# Patient Record
Sex: Female | Born: 1965 | Race: White | Hispanic: No | Marital: Married | Smoking: Current every day smoker
Health system: Southern US, Community
[De-identification: ages and names within clinical notes are randomized; demographics above are authoritative.]

## PROBLEM LIST (undated history)

## (undated) DIAGNOSIS — J45909 Unspecified asthma, uncomplicated: Secondary | ICD-10-CM

## (undated) HISTORY — PX: APPENDECTOMY: SHX54

## (undated) HISTORY — DX: Unspecified asthma, uncomplicated: J45.909

## (undated) HISTORY — PX: TONSILECTOMY/ADENOIDECTOMY WITH MYRINGOTOMY: SHX6125

---

## 1998-05-05 ENCOUNTER — Emergency Department (HOSPITAL_COMMUNITY): Admission: EM | Admit: 1998-05-05 | Discharge: 1998-05-06 | Payer: Self-pay | Admitting: Emergency Medicine

## 1999-03-26 ENCOUNTER — Emergency Department (HOSPITAL_COMMUNITY): Admission: EM | Admit: 1999-03-26 | Discharge: 1999-03-26 | Payer: Self-pay | Admitting: Emergency Medicine

## 1999-03-27 ENCOUNTER — Encounter: Payer: Self-pay | Admitting: Emergency Medicine

## 2021-08-23 ENCOUNTER — Emergency Department (HOSPITAL_COMMUNITY): Payer: Self-pay

## 2021-08-23 ENCOUNTER — Emergency Department (HOSPITAL_COMMUNITY)
Admission: EM | Admit: 2021-08-23 | Discharge: 2021-08-24 | Disposition: A | Discharge status: Home / Self Care | Payer: Self-pay | Attending: Emergency Medicine | Admitting: Emergency Medicine

## 2021-08-23 ENCOUNTER — Other Ambulatory Visit: Payer: Self-pay

## 2021-08-23 DIAGNOSIS — F17210 Nicotine dependence, cigarettes, uncomplicated: Secondary | ICD-10-CM | POA: Insufficient documentation

## 2021-08-23 DIAGNOSIS — J45909 Unspecified asthma, uncomplicated: Secondary | ICD-10-CM | POA: Insufficient documentation

## 2021-08-23 DIAGNOSIS — R102 Pelvic and perineal pain: Secondary | ICD-10-CM

## 2021-08-23 DIAGNOSIS — R112 Nausea with vomiting, unspecified: Secondary | ICD-10-CM | POA: Insufficient documentation

## 2021-08-23 DIAGNOSIS — D4959 Neoplasm of unspecified behavior of other genitourinary organ: Secondary | ICD-10-CM | POA: Insufficient documentation

## 2021-08-23 LAB — URINALYSIS, ROUTINE W REFLEX MICROSCOPIC
Bilirubin Urine: NEGATIVE
Glucose, UA: NEGATIVE mg/dL
Ketones, ur: 20 mg/dL — AB
Nitrite: NEGATIVE
Protein, ur: 100 mg/dL — AB
Specific Gravity, Urine: 1.021 (ref 1.005–1.030)
pH: 5 (ref 5.0–8.0)

## 2021-08-23 LAB — COMPREHENSIVE METABOLIC PANEL
ALT: 42 U/L (ref 0–44)
AST: 33 U/L (ref 15–41)
Albumin: 4.4 g/dL (ref 3.5–5.0)
Alkaline Phosphatase: 85 U/L (ref 38–126)
Anion gap: 16 — ABNORMAL HIGH (ref 5–15)
BUN: 12 mg/dL (ref 6–20)
CO2: 17 mmol/L — ABNORMAL LOW (ref 22–32)
Calcium: 9.8 mg/dL (ref 8.9–10.3)
Chloride: 101 mmol/L (ref 98–111)
Creatinine, Ser: 1.05 mg/dL — ABNORMAL HIGH (ref 0.44–1.00)
GFR, Estimated: >60 mL/min (ref >60)
Glucose, Bld: 149 mg/dL — ABNORMAL HIGH (ref 70–99)
Potassium: 3.6 mmol/L (ref 3.5–5.1)
Sodium: 134 mmol/L — ABNORMAL LOW (ref 135–145)
Total Bilirubin: 0.8 mg/dL (ref 0.3–1.2)
Total Protein: 7.6 g/dL (ref 6.5–8.1)

## 2021-08-23 LAB — CBC
HCT: 47.4 % — ABNORMAL HIGH (ref 36.0–46.0)
Hemoglobin: 15.9 g/dL — ABNORMAL HIGH (ref 12.0–15.0)
MCH: 28.2 pg (ref 26.0–34.0)
MCHC: 33.5 g/dL (ref 30.0–36.0)
MCV: 84 fL (ref 80.0–100.0)
Platelets: 403 10*3/uL — ABNORMAL HIGH (ref 150–400)
RBC: 5.64 MIL/uL — ABNORMAL HIGH (ref 3.87–5.11)
RDW: 12.7 % (ref 11.5–15.5)
WBC: 18 10*3/uL — ABNORMAL HIGH (ref 4.0–10.5)
nRBC: 0 % (ref 0.0–0.2)

## 2021-08-23 LAB — LIPASE, BLOOD: Lipase: 28 U/L (ref 11–51)

## 2021-08-23 MED ORDER — ONDANSETRON HCL 4 MG/2ML IJ SOLN
4.0000 mg | Freq: Once | INTRAMUSCULAR | Status: AC | PRN
  Administered 2021-08-23: 21:00:00 4 mg via INTRAVENOUS

## 2021-08-23 MED ORDER — MORPHINE SULFATE (PF) 2 MG/ML IV SOLN
2.0000 mg | Freq: Once | INTRAVENOUS | Status: AC
  Administered 2021-08-23: 22:00:00 2 mg via INTRAVENOUS
  Filled 2021-08-23: qty 1

## 2021-08-23 NOTE — ED Provider Notes (Signed)
Emergency Medicine Provider Triage Evaluation Note  Sandra Cordova , a 55 y.o. female  was evaluated in triage.  Pt complains of left flank pain that radiates into the left lower quadrant abdomen.  Ports associated nausea and vomiting.  Zofran did not help.  No history of kidney stones.  Also complains of dysuria and urgency.  No fever or chills.  Review of Systems  Positive:  Negative: See above   Physical Exam  BP (!) 150/97    Pulse 89    Temp 99.4 F (37.4 C) (Oral)    Resp 20    SpO2 98%  Gen:   Awake, no distress   Resp:  Normal effort  MSK:   Moves extremities without difficulty Other:  Left CVA tenderness left lower quadrant abdominal tenderness  Medical Decision Making  Medically screening exam initiated at 9:15 PM.  Appropriate orders placed.  Sandra Cordova was informed that the remainder of the evaluation will be completed by another provider, this initial triage assessment does not replace that evaluation, and the importance of remaining in the ED until their evaluation is complete.  Diaphoretic in triage.  In need of emergent care.   Hendricks Limes, PA-C 08/23/21 2116    Lorelle Gibbs, DO 08/24/21 [phone]

## 2021-08-23 NOTE — ED Triage Notes (Signed)
Pt c/o acute onset of LLQ abdominal pain radiating to back and groin area associated with NV, chills. Pt diaphoretic in triage.

## 2021-08-24 ENCOUNTER — Emergency Department (HOSPITAL_COMMUNITY): Payer: Self-pay

## 2021-08-24 MED ORDER — MORPHINE SULFATE (PF) 4 MG/ML IV SOLN
4.0000 mg | Freq: Once | INTRAVENOUS | Status: AC
  Administered 2021-08-24: 4 mg via INTRAVENOUS
  Filled 2021-08-24: qty 1

## 2021-08-24 MED ORDER — SODIUM CHLORIDE 0.9 % IV BOLUS
1000.0000 mL | Freq: Once | INTRAVENOUS | Status: AC
  Administered 2021-08-24: 1000 mL via INTRAVENOUS

## 2021-08-24 MED ORDER — HYDROMORPHONE HCL 1 MG/ML IJ SOLN
1.0000 mg | Freq: Once | INTRAMUSCULAR | Status: AC
  Administered 2021-08-24: 1 mg via INTRAVENOUS
  Filled 2021-08-24: qty 1

## 2021-08-24 MED ORDER — HYDROCODONE-ACETAMINOPHEN 5-325 MG PO TABS: 1.0000 | ORAL_TABLET | Freq: Four times a day (QID) | ORAL | 0 refills | Status: DC | PRN

## 2021-08-24 MED ORDER — ONDANSETRON HCL 4 MG PO TABS: 4.0000 mg | ORAL_TABLET | Freq: Four times a day (QID) | ORAL | 0 refills | Status: DC

## 2021-08-24 NOTE — ED Notes (Signed)
Pt teaching provided on medications that may cause drowsiness. Pt instructed not to drive or operate heavy machinery while taking the prescribed medication. Pt verbalized understanding.  ? ?Pt provided discharge instructions and prescription information. Pt was given the opportunity to ask questions and questions were answered. Discharge signature not obtained in the setting of the COVID-19 pandemic in order to reduce high touch surfaces.  ? ?

## 2021-08-24 NOTE — Discharge Instructions (Addendum)
1.  Ovarian abnormality has been identified in your ultrasound.  It is very important that you follow-up with a gynecologist as soon as possible.  You may choose someone with your family physician, use the referral number in your discharge instructions or set an appointment with [facility].  Do this is soon as possible. 2.  You have been given Vicodin to take for pain.  Take 1 to 2 tablets every 6 hours as needed.  May also take ibuprofen per package instructions every 6-8 hours for baseline pain control.  If you tend to get constipated, take an over-the-counter stool softener such as Colace.  Vicodin can make you constipated. 3.  Return to the emergency department for new worsening or concerning symptoms.

## 2021-08-24 NOTE — ED Provider Notes (Signed)
Patient is having intractable pain with findings of ovarian cystic mass.  No torsion identified by ultrasound.  Continue with pain control, possible GYN admit if pain remains intractable. Physical Exam  BP (!) 167/84    Pulse 83    Temp 97.9 F (36.6 C)    Resp 20    SpO2 94%   Physical Exam  ED Course/Procedures     Procedures  MDM    Patient is now pain-free.  We reviewed a follow-up plan with GYN.  We reviewed pain control with as needed Vicodin.  Patient feels comfortable going home at this time     Charlesetta Shanks, MD 08/24/21 1023

## 2021-08-24 NOTE — ED Notes (Signed)
Pt c/o lt flank pain for over one week  she has had blood in her urine and in her stool   med given at triage has helped

## 2021-09-04 ENCOUNTER — Encounter: Payer: Self-pay | Admitting: Obstetrics & Gynecology

## 2021-09-04 ENCOUNTER — Other Ambulatory Visit: Payer: Self-pay

## 2021-09-04 ENCOUNTER — Ambulatory Visit (INDEPENDENT_AMBULATORY_CARE_PROVIDER_SITE_OTHER): Payer: Self-pay | Admitting: Obstetrics & Gynecology

## 2021-09-04 VITALS — BP 147/96 | HR 73 | Ht 62.0 in | Wt 190.0 lb

## 2021-09-04 DIAGNOSIS — N83201 Unspecified ovarian cyst, right side: Secondary | ICD-10-CM

## 2021-09-04 NOTE — Progress Notes (Signed)
GYNECOLOGY OFFICE VISIT NOTE  History:   Sandra Cordova is a 56 y.o. PMP [id number] here today for discussion about management of incidentally found right ovarian cyst. Reported to the ER on 08/23/21 with left flank pain, CT Renal Stone Study incidentally showed the cyst.  Follow up ultrasound on 08/24/2021 showed minimally complex 7 cm right ovarian cyst.  She was referred to [place] for further management. She is accompanied by her husband.  Denies any past or present lower abdominal pain, just the upper left flank pain that sometimes feels like a sharp twinge. No nausea or vomiting. She denies any abnormal vaginal discharge, bleeding, pelvic pain or other gynecologic concerns.    Past Medical History:  Diagnosis Date   Asthma     Past Surgical History:  Procedure Laterality Date   APPENDECTOMY     TONSILECTOMY/ADENOIDECTOMY WITH MYRINGOTOMY      The following portions of the patient's history were reviewed and updated as appropriate: allergies, current medications, past family history, past medical history, past social history, past surgical history and problem list.   Health Maintenance:  Normal pap and mammogram over 15 years ago.  Benign colonoscopy on 04/07/2017.  Review of Systems:  Pertinent items noted in HPI and remainder of comprehensive ROS otherwise negative.  Physical Exam:  BP (!) 147/96    Pulse 73    Ht 5\' 2"  (1.575 m)    Wt 190 lb (86.2 kg)    BMI 34.75 kg/m  CONSTITUTIONAL: Well-developed, well-nourished female in no acute distress.  HEENT:  Normocephalic, atraumatic. External right and left ear normal. No scleral icterus.  NECK: Normal range of motion, supple, no masses noted on observation SKIN: No rash noted. Not diaphoretic. No erythema. No pallor. MUSCULOSKELETAL: Normal range of motion. No edema noted. NEUROLOGIC: Alert and oriented to person, place, and time. Normal muscle tone coordination. No cranial nerve deficit noted. PSYCHIATRIC: Normal mood and affect.  Normal behavior. Normal judgment and thought content. CARDIOVASCULAR: Normal heart rate noted RESPIRATORY: Effort and breath sounds normal, no problems with respiration noted ABDOMEN: No abdominal tenderness on exam, but upper left flank pain under ribs on palpation. No masses noted. No other overt distention noted.   PELVIC: Deferred  Labs and Imaging No results found for this or any previous visit (from the past 168 hour(s)). CT Renal Stone Study  Result Date: 08/23/2021 CLINICAL DATA:  Left flank pain with kidney stone suspected. EXAM: CT ABDOMEN AND PELVIS WITHOUT CONTRAST TECHNIQUE: Multidetector CT imaging of the abdomen and pelvis was performed following the standard protocol without IV contrast. COMPARISON:  None. FINDINGS: Lower chest: Lung bases are clear. Hepatobiliary: Mild diffuse fatty infiltration of the liver. No focal lesions identified. Gallbladder and bile ducts are unremarkable. Pancreas: Unremarkable. No pancreatic ductal dilatation or surrounding inflammatory changes. Spleen: Normal in size without focal abnormality. Adrenals/Urinary Tract: 12 mm left adrenal gland nodule. Density measurements are 1.4 Hounsfield units suggesting fat containing adenoma. No follow-up indicated. Kidneys are symmetrical. No hydronephrosis or hydroureter. No renal, ureteral, or bladder stones. Stomach/Bowel: Stomach, small bowel, and colon are not abnormally distended. No wall thickening or inflammatory changes appreciated. Sigmoid colonic diverticulosis without evidence of acute diverticulitis. Appendix is not identified. Vascular/Lymphatic: No significant vascular findings are present. No enlarged abdominal or pelvic lymph nodes. Reproductive: The uterus is displaced towards the left with an exophytic calcified fibroid suggesting it. Soft tissue mass anterior to the uterus, likely arising from the right ovary and measuring 5.3 x 7.8 cm. A pedunculated  fibroid would be another possibility. Ultrasound is  suggested for further evaluation. Other: No free air or free fluid in the abdomen. Abdominal wall musculature appears intact. Musculoskeletal: Mild degenerative changes in the spine. No destructive bone lesions. IMPRESSION: 1. No renal or ureteral stone or obstruction. 2. Diverticulosis of the sigmoid colon without evidence of acute diverticulitis. 3. Central and right pelvic mass anterior to the uterus probably represents an ovarian mass but could be a pedunculated fibroid. Other fibroids are demonstrated. Ultrasound pelvis is recommended for further evaluation. 4. Fatty infiltration of the liver. Electronically Signed   By: Lucienne Capers M.D.   On: 08/23/2021 23:53   US PELVIC COMPLETE W TRANSVAGINAL AND TORSION R/O  Result Date: 08/24/2021 CLINICAL DATA:  Initial evaluation for acute pelvic pain, abnormal CT. EXAM: TRANSABDOMINAL AND TRANSVAGINAL ULTRASOUND OF PELVIS DOPPLER ULTRASOUND OF OVARIES TECHNIQUE: Both transabdominal and transvaginal ultrasound examinations of the pelvis were performed. Transabdominal technique was performed for global imaging of the pelvis including uterus, ovaries, adnexal regions, and pelvic cul-de-sac. It was necessary to proceed with endovaginal exam following the transabdominal exam to visualize the endometrium and ovaries. Color and duplex Doppler ultrasound was utilized to evaluate blood flow to the ovaries. COMPARISON:  Prior CT from 08/23/2021. FINDINGS: Uterus Measurements: 5.5 x 3.2 x 4.2 cm = volume: 31.3 mL. Uterus is retroverted. No discrete fibroid or other mass. Endometrium Thickness: 3.9 mm.  No focal abnormality visualized. Right ovary Measurements: 7.5 x 5.9 x 7.0 cm = volume: 161.6 mL. 6.9 x 5.0 x 5.8 cm cystic mass seen positioned within the central/right pelvis, corresponding with abnormality on prior CT. This appears to arise from the right ovary. Cyst is minimally complex in appearance with a few scattered low-level internal echoes. 7 mm eccentric  echogenic focus suggestive of a small calcification, also seen on prior CT. No internal vascularity or other solid component. Left ovary Measurements: 3.5 x 2.4 x 2.5 cm = volume: 10.7 mL. 1 cm echogenic focus noted within the left ovary, likely a small calcification, of doubtful significance. No other adnexal mass. Pulsed Doppler evaluation of both ovaries demonstrates normal low-resistance arterial and venous waveforms. Other findings No abnormal free fluid. IMPRESSION: 1. 6.9 x 5.0 x 5.8 cm minimally complex cystic mass arising from the right ovary, corresponding with abnormality on prior CT. Finding is indeterminate, but could potentially reflect a cystic ovarian neoplasm. Gynecologic consultation and referral for further workup and evaluation recommended. Additionally, further assessment with dedicated pelvic MRI, with and without contrast, may be helpful for further evaluation as warranted. 2. No evidence for ovarian torsion. 3. Otherwise normal sonographic appearance uterus and endometrium. 1 cm echogenic focus within the left ovary likely reflects a small calcification, of doubtful significance. No other adnexal mass or free fluid. Electronically Signed   By: Jeannine Boga M.D.   On: 08/24/2021 02:08      Assessment and Plan:    1. Cyst of right ovary Reviewed imaging findings in detail with patient and her husband, all questions answered.  This is a minimally complex right ovarian cystic lesion, likely benign.  However, given its size and her postmenopausal status, further evaluation and then surgical management is indicated.  This was discussed with patient. Will start with checking tumor markers, and if negative, will proceed with plans for laparoscopic removal of ovary with contained drainage and pelvic washings.  Patient agreed with this plan.  Torsion precautions reviewed.  She was told that it was important to get surgical pathology to definitively  rule out chance of neoplasm. - CA 125 -  AFP tumor marker - HCG, Tumor Marker - Cancer antigen 19-9 - Lactate dehydrogenase  Routine preventative health maintenance measures emphasized, referred to [facility] for [facility] and mammogram. Please refer to After Visit Summary for other counseling recommendations.   Return for Review of results and preoperative visit.    I spent 30 minutes dedicated to the care of this patient including pre-visit review of records, face to face time with the patient discussing her conditions and treatments and post visit orders.    Verita Schneiders, MD, [place] for [employer], [place]

## 2021-09-05 LAB — AFP TUMOR MARKER: AFP, Serum, Tumor Marker: 3.5 ng/mL (ref 0.0–9.2)

## 2021-09-05 LAB — CA 125: Cancer Antigen (CA) 125: 9.7 U/mL (ref 0.0–38.1)

## 2021-09-05 LAB — LACTATE DEHYDROGENASE: LDH: 186 IU/L (ref 119–226)

## 2021-09-05 LAB — CANCER ANTIGEN 19-9: CA 19-9: 4 U/mL (ref 0–35)

## 2021-09-05 LAB — BETA HCG QUANT (REF LAB): hCG Quant: 2 m[IU]/mL

## 2021-09-08 NOTE — Progress Notes (Signed)
Result Note  Negative tumor markers for the minimally complex right ovarian cystic lesion.  Message to be sent for surgical scheduling as planned.  Patient needs laparoscopic salpingo-oophorectomy, likely bilateral with pelvic washings. Large ovarian cyst in the postmenopausal period, need to rule out neoplasm  Once surgical date is known, she will need appointment with me for preoperative visit, discussion of surgery. This can be done 2-3 weeks before surgery if possible.  Please call to inform patient of results and recommendations. Thank you!   Verita Schneiders, MD, [place] for [employer], [place]

## 2021-09-09 ENCOUNTER — Telehealth: Payer: Self-pay

## 2021-09-09 NOTE — Telephone Encounter (Signed)
-----   Message from Osborne Oman, MD sent at 09/08/2021  9:22 AM EST ----- Result Note  Negative tumor markers for the minimally complex right ovarian cystic lesion.  Message to be sent for surgical scheduling as planned.  Patient needs laparoscopic salpingo-oophorectomy, likely bilateral with pelvic washings. Large ovarian cyst in the postmenopausal period, need to rule out neoplasm  Once surgical date is known, she will need appointment with me for preoperative visit, discussion of surgery. This can be done 2-3 weeks before surgery if possible.  Please call to inform patient of results and recommendations. Thank you!   Verita Schneiders, MD, [place] for [employer], [place]

## 2021-09-09 NOTE — Telephone Encounter (Signed)
Patient called back and made aware of negative tumor markers and that surgery scheduler will be reaching out to her to schedule the surgery needed. Patient states understanding. Kathrene Alu RN

## 2021-09-09 NOTE — Telephone Encounter (Signed)
Unable to leave message for patient. Kathrene Alu RN

## 2021-09-12 ENCOUNTER — Telehealth: Payer: Self-pay

## 2021-09-12 NOTE — Telephone Encounter (Signed)
Patient made aware that she can take ibuprofen for her pain. Patient states her pain is on the left side. Patient made aware that if her pain increases she should seek evaluation at the emergency room per Dr. Harolyn Rutherford. Patient states she cant afford to keep going to the ER.   Patient questions her appointment and review her pre op appt with [place] on Feb 13th and then her surgery is March 8th and then she has a post op with our office on March 20th. Patient states understanding. Kathrene Alu RN

## 2021-09-12 NOTE — Telephone Encounter (Signed)
-----   Message from Maurine Minister, [place] sent at 09/12/2021 10:09 AM EST ----- Regarding: Rx Request Patient is having surgery on 10/30/2021.  She is in pain and requesting meds.

## 2021-09-24 NOTE — ED Provider Notes (Signed)
[facility] Emergency Department Provider Note MRN:  [id number]  Arrival date & time: 09/24/21     Chief Complaint   Abdominal Pain   History of Present Illness   Sandra Cordova is a 56 y.o. year-old female with a history of asthma presenting to the ED with chief complaint of abdominal pain.  Left lower quadrant abdominal pain for the past week.  Involving the left flank as well.  Associated with nausea and vomiting.  Review of Systems  A thorough review of systems was obtained and all systems are negative except as noted in the HPI and PMH.   Patient's Health History    Past Medical History:  Diagnosis Date   Asthma     Past Surgical History:  Procedure Laterality Date   APPENDECTOMY     TONSILECTOMY/ADENOIDECTOMY WITH MYRINGOTOMY      Family History  Problem Relation Age of Onset   COPD Father     Social History   Socioeconomic History   Marital status: Married    Spouse name: Not on file   Number of children: Not on file   Years of education: Not on file   Highest education level: Not on file  Occupational History   Not on file  Tobacco Use   Smoking status: Every Day    Packs/day: 0.25    Types: Cigarettes   Smokeless tobacco: Never  Substance and Sexual Activity   Alcohol use: Yes    Alcohol/week: 2.0 standard drinks    Types: 2 Glasses of wine per week   Drug use: Never   Sexual activity: Yes  Other Topics Concern   Not on file  Social History Narrative   Not on file   Social Determinants of Health   Financial Resource Strain: Not on file  Food Insecurity: Not on file  Transportation Needs: Not on file  Physical Activity: Not on file  Stress: Not on file  Social Connections: Not on file  Intimate Partner Violence: Not on file     Physical Exam   Vitals:   08/24/21 1000 08/24/21 1015  BP: 119/76 118/76  Pulse: 63 64  Resp: 16 14  Temp:  98.9 F (37.2 C)  SpO2: 98% 98%    CONSTITUTIONAL: Well-appearing,  NAD NEURO/PSYCH:  Alert and oriented x 3, no focal deficits EYES:  eyes equal and reactive ENT/NECK:  no LAD, no JVD CARDIO: Regular rate, well-perfused, normal S1 and S2 PULM:  CTAB no wheezing or rhonchi GI/GU:  non-distended, mild left CVA tenderness MSK/SPINE:  No gross deformities, no edema SKIN:  no rash, atraumatic   *Additional and/or pertinent findings included in MDM below  Diagnostic and Interventional Summary    EKG Interpretation  Date/Time:    Ventricular Rate:    PR Interval:    QRS Duration:   QT Interval:    QTC Calculation:   R Axis:     Text Interpretation:         Labs Reviewed  COMPREHENSIVE METABOLIC PANEL - Abnormal; Notable for the following components:      Result Value   Sodium 134 (*)    CO2 17 (*)    Glucose, Bld 149 (*)    Creatinine, Ser 1.05 (*)    Anion gap 16 (*)    All other components within normal limits  CBC - Abnormal; Notable for the following components:   WBC 18.0 (*)    RBC 5.64 (*)    Hemoglobin 15.9 (*)  HCT 47.4 (*)    Platelets 403 (*)    All other components within normal limits  URINALYSIS, ROUTINE W REFLEX MICROSCOPIC - Abnormal; Notable for the following components:   APPearance HAZY (*)    Hgb urine dipstick SMALL (*)    Ketones, ur 20 (*)    Protein, ur 100 (*)    Leukocytes,Ua MODERATE (*)    Bacteria, UA RARE (*)    All other components within normal limits  LIPASE, BLOOD    US PELVIC COMPLETE W TRANSVAGINAL AND TORSION R/O  Final Result    CT Renal Stone Study  Final Result      Medications  ondansetron [facility]) injection 4 mg (4 mg Intravenous Given 08/23/21 2119)  morphine 2 MG/ML injection 2 mg (2 mg Intravenous Given 08/23/21 2136)  sodium chloride 0.9 % bolus 1,000 mL (0 mLs Intravenous Stopped 08/24/21 0744)  morphine 4 MG/ML injection 4 mg (4 mg Intravenous Given 08/24/21 0634)  HYDROmorphone (DILAUDID) injection 1 mg (1 mg Intravenous Given 08/24/21 0740)     Procedures  /  Critical  Care Procedures  ED Course and Medical Decision Making  Initial Impression and Ddx Differential diagnosis includes kidney stone, diverticulitis or ovarian cyst, awaiting labs, CT  Past medical/surgical history that increases complexity of ED encounter: None  Interpretation of Diagnostics Laboratory evaluation is overall reassuring.  CT is without kidney stone, there is question of pelvic mass, possibly ovarian cyst.  Patient Reassessment and Ultimate Disposition/Management Patient continues to have pain.  Will obtain ultrasound to exclude torsion.  Signed out to oncoming provider.  Patient management required discussion with the following services or consulting groups:  None  Complexity of Problems Addressed Acute complicated illness or Injury  Additional Data Reviewed and Analyzed Further history obtained from: Past medical history and medications listed in the EMR  Factors Impacting ED Encounter Risk Use of parenteral controlled substances  Barth Kirks. 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 Pelvic pain  R10.2 US PELVIC COMPLETE W TRANSVAGINAL AND TORSION R/O    US PELVIC COMPLETE W TRANSVAGINAL AND TORSION R/O      ED Discharge Orders          Ordered    HYDROcodone-acetaminophen (NORCO/VICODIN) 5-325 MG tablet  Every 6 hours PRN        08/24/21 1025    ondansetron (ZOFRAN) 4 MG tablet  Every 6 hours        08/24/21 1025             Discharge Instructions Discussed with and Provided to Patient:     Maudie Flakes, MD 09/24/21 [phone]

## 2021-09-27 ENCOUNTER — Other Ambulatory Visit: Payer: Self-pay | Admitting: Obstetrics and Gynecology

## 2021-10-07 ENCOUNTER — Institutional Professional Consult (permissible substitution): Payer: Self-pay | Admitting: Obstetrics & Gynecology

## 2021-10-07 ENCOUNTER — Ambulatory Visit (INDEPENDENT_AMBULATORY_CARE_PROVIDER_SITE_OTHER): Payer: Self-pay | Admitting: Obstetrics & Gynecology

## 2021-10-07 ENCOUNTER — Encounter: Payer: Self-pay | Admitting: Obstetrics & Gynecology

## 2021-10-07 ENCOUNTER — Other Ambulatory Visit: Payer: Self-pay

## 2021-10-07 VITALS — BP 146/86 | HR 71 | Ht 62.75 in | Wt 186.0 lb

## 2021-10-07 DIAGNOSIS — N83201 Unspecified ovarian cyst, right side: Secondary | ICD-10-CM

## 2021-10-07 NOTE — H&P (View-Only) (Signed)
GYNECOLOGY OFFICE VISIT NOTE  History:   Sandra Cordova is a 56 y.o. [id number] here today for discussion about upcoming surgery for her complex right ovarian cyst on 10/30/2021; scheduled for bilateral salpingo-oophorectomy. Accompanied by her husband. She denies any abnormal vaginal discharge, bleeding, pelvic pain or other concerns.    Past Medical History:  Diagnosis Date   Asthma     Past Surgical History:  Procedure Laterality Date   APPENDECTOMY     TONSILECTOMY/ADENOIDECTOMY WITH MYRINGOTOMY      The following portions of the patient's history were reviewed and updated as appropriate: allergies, current medications, past family history, past medical history, past social history, past surgical history and problem list.   Review of Systems:  Pertinent items noted in HPI and remainder of comprehensive ROS otherwise negative.  Physical Exam:  BP (!) 146/86    Pulse 71    Ht 5' 2.75" (1.594 m)    Wt 186 lb (84.4 kg)    BMI 33.21 kg/m  CONSTITUTIONAL: Well-developed, well-nourished female in no acute distress.  HEENT:  Normocephalic, atraumatic. External right and left ear normal. No scleral icterus.  NECK: Normal range of motion, supple, no masses noted on observation SKIN: No rash noted. Not diaphoretic. No erythema. No pallor. MUSCULOSKELETAL: Normal range of motion. No edema noted. NEUROLOGIC: Alert and oriented to person, place, and time. Normal muscle tone coordination. No cranial nerve deficit noted. PSYCHIATRIC: Normal mood and affect. Normal behavior. Normal judgment and thought content. CARDIOVASCULAR: Normal heart rate noted RESPIRATORY: Effort and breath sounds normal, no problems with respiration noted ABDOMEN: No masses noted. No other overt distention noted.   PELVIC: Deferred  Labs and Imaging Results for orders placed or performed in visit on 09/04/21 (from the past 1008 hour(s))  CA 125   Collection Time: 09/04/21  2:00 PM  Result Value Ref Range   Cancer  Antigen (CA) 125 9.7 0.0 - 38.1 U/mL  AFP tumor marker   Collection Time: 09/04/21  2:00 PM  Result Value Ref Range   AFP, Serum, Tumor Marker 3.5 0.0 - 9.2 ng/mL  HCG, Tumor Marker   Collection Time: 09/04/21  2:00 PM  Result Value Ref Range   hCG Quant 2 mIU/mL  Cancer antigen 19-9   Collection Time: 09/04/21  2:00 PM  Result Value Ref Range   CA 19-9 4 0 - 35 U/mL  Lactate dehydrogenase   Collection Time: 09/04/21  2:00 PM  Result Value Ref Range   LDH 186 119 - 226 IU/L   CT Renal Stone Study  Result Date: 08/23/2021 CLINICAL DATA:  Left flank pain with kidney stone suspected. EXAM: CT ABDOMEN AND PELVIS WITHOUT CONTRAST TECHNIQUE: Multidetector CT imaging of the abdomen and pelvis was performed following the standard protocol without IV contrast. COMPARISON:  None. FINDINGS: Lower chest: Lung bases are clear. Hepatobiliary: Mild diffuse fatty infiltration of the liver. No focal lesions identified. Gallbladder and bile ducts are unremarkable. Pancreas: Unremarkable. No pancreatic ductal dilatation or surrounding inflammatory changes. Spleen: Normal in size without focal abnormality. Adrenals/Urinary Tract: 12 mm left adrenal gland nodule. Density measurements are 1.4 Hounsfield units suggesting fat containing adenoma. No follow-up indicated. Kidneys are symmetrical. No hydronephrosis or hydroureter. No renal, ureteral, or bladder stones. Stomach/Bowel: Stomach, small bowel, and colon are not abnormally distended. No wall thickening or inflammatory changes appreciated. Sigmoid colonic diverticulosis without evidence of acute diverticulitis. Appendix is not identified. Vascular/Lymphatic: No significant vascular findings are present. No enlarged abdominal or pelvic lymph nodes.  Reproductive: The uterus is displaced towards the left with an exophytic calcified fibroid suggesting it. Soft tissue mass anterior to the uterus, likely arising from the right ovary and measuring 5.3 x 7.8 cm. A  pedunculated fibroid would be another possibility. Ultrasound is suggested for further evaluation. Other: No free air or free fluid in the abdomen. Abdominal wall musculature appears intact. Musculoskeletal: Mild degenerative changes in the spine. No destructive bone lesions. IMPRESSION: 1. No renal or ureteral stone or obstruction. 2. Diverticulosis of the sigmoid colon without evidence of acute diverticulitis. 3. Central and right pelvic mass anterior to the uterus probably represents an ovarian mass but could be a pedunculated fibroid. Other fibroids are demonstrated. Ultrasound pelvis is recommended for further evaluation. 4. Fatty infiltration of the liver. Electronically Signed   By: Lucienne Capers M.D.   On: 08/23/2021 23:53   US PELVIC COMPLETE W TRANSVAGINAL AND TORSION R/O  Result Date: 08/24/2021 CLINICAL DATA:  Initial evaluation for acute pelvic pain, abnormal CT. EXAM: TRANSABDOMINAL AND TRANSVAGINAL ULTRASOUND OF PELVIS DOPPLER ULTRASOUND OF OVARIES TECHNIQUE: Both transabdominal and transvaginal ultrasound examinations of the pelvis were performed. Transabdominal technique was performed for global imaging of the pelvis including uterus, ovaries, adnexal regions, and pelvic cul-de-sac. It was necessary to proceed with endovaginal exam following the transabdominal exam to visualize the endometrium and ovaries. Color and duplex Doppler ultrasound was utilized to evaluate blood flow to the ovaries. COMPARISON:  Prior CT from 08/23/2021. FINDINGS: Uterus Measurements: 5.5 x 3.2 x 4.2 cm = volume: 31.3 mL. Uterus is retroverted. No discrete fibroid or other mass. Endometrium Thickness: 3.9 mm.  No focal abnormality visualized. Right ovary Measurements: 7.5 x 5.9 x 7.0 cm = volume: 161.6 mL. 6.9 x 5.0 x 5.8 cm cystic mass seen positioned within the central/right pelvis, corresponding with abnormality on prior CT. This appears to arise from the right ovary. Cyst is minimally complex in appearance with  a few scattered low-level internal echoes. 7 mm eccentric echogenic focus suggestive of a small calcification, also seen on prior CT. No internal vascularity or other solid component. Left ovary Measurements: 3.5 x 2.4 x 2.5 cm = volume: 10.7 mL. 1 cm echogenic focus noted within the left ovary, likely a small calcification, of doubtful significance. No other adnexal mass. Pulsed Doppler evaluation of both ovaries demonstrates normal low-resistance arterial and venous waveforms. Other findings No abnormal free fluid. IMPRESSION: 1. 6.9 x 5.0 x 5.8 cm minimally complex cystic mass arising from the right ovary, corresponding with abnormality on prior CT. Finding is indeterminate, but could potentially reflect a cystic ovarian neoplasm. Gynecologic consultation and referral for further workup and evaluation recommended. Additionally, further assessment with dedicated pelvic MRI, with and without contrast, may be helpful for further evaluation as warranted. 2. No evidence for ovarian torsion. 3. Otherwise normal sonographic appearance uterus and endometrium. 1 cm echogenic focus within the left ovary likely reflects a small calcification, of doubtful significance. No other adnexal mass or free fluid. Electronically Signed   By: Jeannine Boga M.D.   On: 08/24/2021 02:08       Assessment and Plan:    1. Cyst of right ovary Discussed upcoming surgery; patient is scheduled for laparoscopic bilateral salpingo-oophorectomy on 10/30/2021. Negative tumor markers, cyst is likely benign but pathologic evaluation is needed. The risks of surgery were discussed in detail with the patient including but not limited to: bleeding which may require transfusion or reoperation; infection which may require prolonged hospitalization or re-hospitalization and antibiotic therapy; injury  to bowel, bladder, ureters and major vessels or other surrounding organs which may lead to other procedures; formation of adhesions; need for  additional procedures including laparotomy or subsequent procedures secondary to intraoperative injury or abnormal pathology; thromboembolic phenomenon; incisional problems and other postoperative or anesthesia complications.  Patient was told that the likelihood that her condition and symptoms will be treated effectively with this surgical management was very high; the postoperative expectations were also discussed in detail. The patient also understands the alternative treatment options which were discussed in full. All questions were answered.  She was told that routine preoperative instructions will be given to her by the preoperative nursing team.    Printed patient education handouts about the procedure were given to the patient to review at home.  Preoperative orders have been signed and held for this procedure.   Of note, patient has been referred to [facility] to obtain pap smear and mammogram, has an appointment on 10/09/2021.   Return for any gynecologic concerns.    I spent 30 minutes dedicated to the care of this patient including pre-visit review of records, face to face time with the patient discussing her conditions and treatments and post visit orders.    Verita Schneiders, MD, [place] for [facility], [place]

## 2021-10-07 NOTE — Progress Notes (Signed)
GYNECOLOGY OFFICE VISIT NOTE  History:   Sandra Cordova is a 56 y.o. [id number] here today for discussion about upcoming surgery for her complex right ovarian cyst on 10/30/2021; scheduled for bilateral salpingo-oophorectomy. Accompanied by her husband. She denies any abnormal vaginal discharge, bleeding, pelvic pain or other concerns.    Past Medical History:  Diagnosis Date   Asthma     Past Surgical History:  Procedure Laterality Date   APPENDECTOMY     TONSILECTOMY/ADENOIDECTOMY WITH MYRINGOTOMY      The following portions of the patient's history were reviewed and updated as appropriate: allergies, current medications, past family history, past medical history, past social history, past surgical history and problem list.   Review of Systems:  Pertinent items noted in HPI and remainder of comprehensive ROS otherwise negative.  Physical Exam:  BP (!) 146/86    Pulse 71    Ht 5' 2.75" (1.594 m)    Wt 186 lb (84.4 kg)    BMI 33.21 kg/m  CONSTITUTIONAL: Well-developed, well-nourished female in no acute distress.  HEENT:  Normocephalic, atraumatic. External right and left ear normal. No scleral icterus.  NECK: Normal range of motion, supple, no masses noted on observation SKIN: No rash noted. Not diaphoretic. No erythema. No pallor. MUSCULOSKELETAL: Normal range of motion. No edema noted. NEUROLOGIC: Alert and oriented to person, place, and time. Normal muscle tone coordination. No cranial nerve deficit noted. PSYCHIATRIC: Normal mood and affect. Normal behavior. Normal judgment and thought content. CARDIOVASCULAR: Normal heart rate noted RESPIRATORY: Effort and breath sounds normal, no problems with respiration noted ABDOMEN: No masses noted. No other overt distention noted.   PELVIC: Deferred  Labs and Imaging Results for orders placed or performed in visit on 09/04/21 (from the past 1008 hour(s))  CA 125   Collection Time: 09/04/21  2:00 PM  Result Value Ref Range   Cancer  Antigen (CA) 125 9.7 0.0 - 38.1 U/mL  AFP tumor marker   Collection Time: 09/04/21  2:00 PM  Result Value Ref Range   AFP, Serum, Tumor Marker 3.5 0.0 - 9.2 ng/mL  HCG, Tumor Marker   Collection Time: 09/04/21  2:00 PM  Result Value Ref Range   hCG Quant 2 mIU/mL  Cancer antigen 19-9   Collection Time: 09/04/21  2:00 PM  Result Value Ref Range   CA 19-9 4 0 - 35 U/mL  Lactate dehydrogenase   Collection Time: 09/04/21  2:00 PM  Result Value Ref Range   LDH 186 119 - 226 IU/L   CT Renal Stone Study  Result Date: 08/23/2021 CLINICAL DATA:  Left flank pain with kidney stone suspected. EXAM: CT ABDOMEN AND PELVIS WITHOUT CONTRAST TECHNIQUE: Multidetector CT imaging of the abdomen and pelvis was performed following the standard protocol without IV contrast. COMPARISON:  None. FINDINGS: Lower chest: Lung bases are clear. Hepatobiliary: Mild diffuse fatty infiltration of the liver. No focal lesions identified. Gallbladder and bile ducts are unremarkable. Pancreas: Unremarkable. No pancreatic ductal dilatation or surrounding inflammatory changes. Spleen: Normal in size without focal abnormality. Adrenals/Urinary Tract: 12 mm left adrenal gland nodule. Density measurements are 1.4 Hounsfield units suggesting fat containing adenoma. No follow-up indicated. Kidneys are symmetrical. No hydronephrosis or hydroureter. No renal, ureteral, or bladder stones. Stomach/Bowel: Stomach, small bowel, and colon are not abnormally distended. No wall thickening or inflammatory changes appreciated. Sigmoid colonic diverticulosis without evidence of acute diverticulitis. Appendix is not identified. Vascular/Lymphatic: No significant vascular findings are present. No enlarged abdominal or pelvic lymph nodes.  Reproductive: The uterus is displaced towards the left with an exophytic calcified fibroid suggesting it. Soft tissue mass anterior to the uterus, likely arising from the right ovary and measuring 5.3 x 7.8 cm. A  pedunculated fibroid would be another possibility. Ultrasound is suggested for further evaluation. Other: No free air or free fluid in the abdomen. Abdominal wall musculature appears intact. Musculoskeletal: Mild degenerative changes in the spine. No destructive bone lesions. IMPRESSION: 1. No renal or ureteral stone or obstruction. 2. Diverticulosis of the sigmoid colon without evidence of acute diverticulitis. 3. Central and right pelvic mass anterior to the uterus probably represents an ovarian mass but could be a pedunculated fibroid. Other fibroids are demonstrated. Ultrasound pelvis is recommended for further evaluation. 4. Fatty infiltration of the liver. Electronically Signed   By: Lucienne Capers M.D.   On: 08/23/2021 23:53   US PELVIC COMPLETE W TRANSVAGINAL AND TORSION R/O  Result Date: 08/24/2021 CLINICAL DATA:  Initial evaluation for acute pelvic pain, abnormal CT. EXAM: TRANSABDOMINAL AND TRANSVAGINAL ULTRASOUND OF PELVIS DOPPLER ULTRASOUND OF OVARIES TECHNIQUE: Both transabdominal and transvaginal ultrasound examinations of the pelvis were performed. Transabdominal technique was performed for global imaging of the pelvis including uterus, ovaries, adnexal regions, and pelvic cul-de-sac. It was necessary to proceed with endovaginal exam following the transabdominal exam to visualize the endometrium and ovaries. Color and duplex Doppler ultrasound was utilized to evaluate blood flow to the ovaries. COMPARISON:  Prior CT from 08/23/2021. FINDINGS: Uterus Measurements: 5.5 x 3.2 x 4.2 cm = volume: 31.3 mL. Uterus is retroverted. No discrete fibroid or other mass. Endometrium Thickness: 3.9 mm.  No focal abnormality visualized. Right ovary Measurements: 7.5 x 5.9 x 7.0 cm = volume: 161.6 mL. 6.9 x 5.0 x 5.8 cm cystic mass seen positioned within the central/right pelvis, corresponding with abnormality on prior CT. This appears to arise from the right ovary. Cyst is minimally complex in appearance with  a few scattered low-level internal echoes. 7 mm eccentric echogenic focus suggestive of a small calcification, also seen on prior CT. No internal vascularity or other solid component. Left ovary Measurements: 3.5 x 2.4 x 2.5 cm = volume: 10.7 mL. 1 cm echogenic focus noted within the left ovary, likely a small calcification, of doubtful significance. No other adnexal mass. Pulsed Doppler evaluation of both ovaries demonstrates normal low-resistance arterial and venous waveforms. Other findings No abnormal free fluid. IMPRESSION: 1. 6.9 x 5.0 x 5.8 cm minimally complex cystic mass arising from the right ovary, corresponding with abnormality on prior CT. Finding is indeterminate, but could potentially reflect a cystic ovarian neoplasm. Gynecologic consultation and referral for further workup and evaluation recommended. Additionally, further assessment with dedicated pelvic MRI, with and without contrast, may be helpful for further evaluation as warranted. 2. No evidence for ovarian torsion. 3. Otherwise normal sonographic appearance uterus and endometrium. 1 cm echogenic focus within the left ovary likely reflects a small calcification, of doubtful significance. No other adnexal mass or free fluid. Electronically Signed   By: Jeannine Boga M.D.   On: 08/24/2021 02:08       Assessment and Plan:    1. Cyst of right ovary Discussed upcoming surgery; patient is scheduled for laparoscopic bilateral salpingo-oophorectomy on 10/30/2021. Negative tumor markers, cyst is likely benign but pathologic evaluation is needed. The risks of surgery were discussed in detail with the patient including but not limited to: bleeding which may require transfusion or reoperation; infection which may require prolonged hospitalization or re-hospitalization and antibiotic therapy; injury  to bowel, bladder, ureters and major vessels or other surrounding organs which may lead to other procedures; formation of adhesions; need for  additional procedures including laparotomy or subsequent procedures secondary to intraoperative injury or abnormal pathology; thromboembolic phenomenon; incisional problems and other postoperative or anesthesia complications.  Patient was told that the likelihood that her condition and symptoms will be treated effectively with this surgical management was very high; the postoperative expectations were also discussed in detail. The patient also understands the alternative treatment options which were discussed in full. All questions were answered.  She was told that routine preoperative instructions will be given to her by the preoperative nursing team.    Printed patient education handouts about the procedure were given to the patient to review at home.  Preoperative orders have been signed and held for this procedure.   Of note, patient has been referred to [facility] to obtain pap smear and mammogram, has an appointment on 10/09/2021.   Return for any gynecologic concerns.    I spent 30 minutes dedicated to the care of this patient including pre-visit review of records, face to face time with the patient discussing her conditions and treatments and post visit orders.    Verita Schneiders, MD, [place] for [facility], [place]

## 2021-10-09 ENCOUNTER — Other Ambulatory Visit: Payer: Self-pay

## 2021-10-09 ENCOUNTER — Other Ambulatory Visit: Payer: Self-pay | Admitting: *Deleted

## 2021-10-09 DIAGNOSIS — Z124 Encounter for screening for malignant neoplasm of cervix: Secondary | ICD-10-CM

## 2021-10-09 NOTE — Progress Notes (Signed)
Patient: Sandra Cordova           Date of Birth: Aug 02, 1966           MRN: [id number] Visit Date: 10/09/2021 PCP: Pcp, No  Cervical Cancer Screening Do you smoke?: Yes Have you ever had or been told you have an allergy to latex products?: No Marital status: Married Date of last pap smear: More than 5 yrs ago Date of last menstrual period:  (Postmenopausal) Number of pregnancies: 3 Number of births: 1 Have you ever had any of the following? Hysterectomy: No Tubal ligation (tubes tied): No Abnormal bleeding: Yes (Spotting) Abnormal pap smear: Yes Venereal warts: No A sex partner with venereal warts: No A high risk* sex partner: No  Cervical Exam  Abnormal Observations: Cervix friable. Thick yellowish colored discharge observed in vagina. Wet prep completed. Recommendations: Last Pap smear was completed around 25 years ago. Patient unsure of the clinic that her last Pap smear was completed. Per patient has no history of an abnormal Pap smear. No Pap smear results are available in Epic. Let patient know that if today's Pap smear is normal with negative HPV that her next Pap smear will be due in 5 years. Informed patient will call within the next week with results of her wet prep and Pap smear by phone. Patient verbalized understanding.     Patient's History There are no problems to display for this patient.  Past Medical History:  Diagnosis Date   Asthma     Family History  Problem Relation Age of Onset   COPD Father     Social History   Occupational History   Not on file  Tobacco Use   Smoking status: Every Day    Packs/day: 0.25    Types: Cigarettes   Smokeless tobacco: Never  Substance and Sexual Activity   Alcohol use: Yes    Alcohol/week: 2.0 standard drinks    Types: 2 Glasses of wine per week   Drug use: Never   Sexual activity: Yes

## 2021-10-10 ENCOUNTER — Telehealth: Payer: Self-pay

## 2021-10-10 LAB — CERVICOVAGINAL ANCILLARY ONLY
Bacterial Vaginitis (gardnerella): POSITIVE — AB
Candida Glabrata: NEGATIVE
Candida Vaginitis: NEGATIVE
Comment: NEGATIVE
Comment: NEGATIVE
Comment: NEGATIVE
Comment: NEGATIVE
Trichomonas: POSITIVE — AB

## 2021-10-10 MED ORDER — METRONIDAZOLE 500 MG PO TABS: 500.0000 mg | ORAL_TABLET | Freq: Two times a day (BID) | ORAL | 0 refills | Status: DC

## 2021-10-10 NOTE — Telephone Encounter (Signed)
Attempted to contact patient regarding lab results, voicemail box not set up.

## 2021-10-10 NOTE — Addendum Note (Signed)
Addended by: Mora Bellman on: 10/10/2021 12:00 PM   Modules accepted: Orders

## 2021-10-11 ENCOUNTER — Other Ambulatory Visit: Payer: Self-pay

## 2021-10-11 ENCOUNTER — Telehealth: Payer: Self-pay

## 2021-10-11 LAB — CYTOLOGY - PAP
Comment: NEGATIVE
Diagnosis: NEGATIVE
Diagnosis: REACTIVE
High risk HPV: NEGATIVE

## 2021-10-11 MED ORDER — METRONIDAZOLE 500 MG PO TABS: 500.0000 mg | ORAL_TABLET | Freq: Two times a day (BID) | ORAL | 0 refills | Status: DC

## 2021-10-11 NOTE — Telephone Encounter (Signed)
Patient informed wet prep results, Positive for Bacterial vaginitis, and Trichomonas. Discussed with patient. Patient verbalized understanding. Rx metronidazole resent to [facility] per Patient's request (previously sent to [facility]).

## 2021-10-14 ENCOUNTER — Telehealth: Payer: Self-pay

## 2021-10-14 NOTE — Telephone Encounter (Signed)
Patient informed negative Pap/HPV results, with upcoming procedure, needs to verify with ob-gyn when or if next pap will be due. Patient verbalized understanding.

## 2021-10-21 ENCOUNTER — Encounter (HOSPITAL_BASED_OUTPATIENT_CLINIC_OR_DEPARTMENT_OTHER): Payer: Self-pay | Admitting: Obstetrics & Gynecology

## 2021-10-21 ENCOUNTER — Other Ambulatory Visit: Payer: Self-pay

## 2021-10-23 ENCOUNTER — Telehealth: Payer: Self-pay

## 2021-10-23 NOTE — Telephone Encounter (Signed)
Called patient to see if she had applied for financial assistance, patient has not applied but wanted to proceed with the surgery. I informed the patient that the physician fee for the surgery is $2827.50 and it would need to be paid by Friday 3/3 or her surgery would be cancelled. Explained to patient this price does not include the hospital charges and anesthesia charges. Patient expressed understanding. ?

## 2021-10-28 ENCOUNTER — Encounter (HOSPITAL_BASED_OUTPATIENT_CLINIC_OR_DEPARTMENT_OTHER): Payer: Self-pay | Admitting: Obstetrics & Gynecology

## 2021-10-29 ENCOUNTER — Encounter (HOSPITAL_BASED_OUTPATIENT_CLINIC_OR_DEPARTMENT_OTHER)
Admission: RE | Admit: 2021-10-29 | Discharge: 2021-10-29 | Disposition: A | Discharge status: Home / Self Care | Payer: Self-pay | Source: Ambulatory Visit | Attending: Obstetrics & Gynecology | Admitting: Obstetrics & Gynecology

## 2021-10-29 DIAGNOSIS — Z01818 Encounter for other preprocedural examination: Secondary | ICD-10-CM | POA: Insufficient documentation

## 2021-10-29 DIAGNOSIS — N83201 Unspecified ovarian cyst, right side: Secondary | ICD-10-CM | POA: Insufficient documentation

## 2021-10-29 LAB — CBC
HCT: 47.1 % — ABNORMAL HIGH (ref 36.0–46.0)
Hemoglobin: 15.2 g/dL — ABNORMAL HIGH (ref 12.0–15.0)
MCH: 27.8 pg (ref 26.0–34.0)
MCHC: 32.3 g/dL (ref 30.0–36.0)
MCV: 86.1 fL (ref 80.0–100.0)
Platelets: 358 10*3/uL (ref 150–400)
RBC: 5.47 MIL/uL — ABNORMAL HIGH (ref 3.87–5.11)
RDW: 12.7 % (ref 11.5–15.5)
WBC: 11 10*3/uL — ABNORMAL HIGH (ref 4.0–10.5)
nRBC: 0 % (ref 0.0–0.2)

## 2021-10-29 LAB — BASIC METABOLIC PANEL
Anion gap: 9 (ref 5–15)
BUN: 13 mg/dL (ref 6–20)
CO2: 24 mmol/L (ref 22–32)
Calcium: 9.2 mg/dL (ref 8.9–10.3)
Chloride: 106 mmol/L (ref 98–111)
Creatinine, Ser: 0.91 mg/dL (ref 0.44–1.00)
GFR, Estimated: >60 mL/min (ref >60)
Glucose, Bld: 103 mg/dL — ABNORMAL HIGH (ref 70–99)
Potassium: 4.4 mmol/L (ref 3.5–5.1)
Sodium: 139 mmol/L (ref 135–145)

## 2021-10-30 ENCOUNTER — Encounter (HOSPITAL_BASED_OUTPATIENT_CLINIC_OR_DEPARTMENT_OTHER)
Admission: RE | Disposition: A | Discharge status: Home / Self Care | Payer: Self-pay | Source: Home / Self Care | Attending: Obstetrics & Gynecology

## 2021-10-30 ENCOUNTER — Other Ambulatory Visit: Payer: Self-pay

## 2021-10-30 ENCOUNTER — Ambulatory Visit (HOSPITAL_BASED_OUTPATIENT_CLINIC_OR_DEPARTMENT_OTHER)
Admission: RE | Admit: 2021-10-30 | Discharge: 2021-10-30 | Disposition: A | Discharge status: Home / Self Care | Payer: Self-pay | Attending: Obstetrics & Gynecology | Admitting: Obstetrics & Gynecology

## 2021-10-30 ENCOUNTER — Ambulatory Visit (HOSPITAL_BASED_OUTPATIENT_CLINIC_OR_DEPARTMENT_OTHER): Payer: Self-pay | Admitting: Anesthesiology

## 2021-10-30 ENCOUNTER — Encounter (HOSPITAL_BASED_OUTPATIENT_CLINIC_OR_DEPARTMENT_OTHER): Payer: Self-pay | Admitting: Obstetrics & Gynecology

## 2021-10-30 DIAGNOSIS — N83291 Other ovarian cyst, right side: Secondary | ICD-10-CM

## 2021-10-30 DIAGNOSIS — N83209 Unspecified ovarian cyst, unspecified side: Secondary | ICD-10-CM

## 2021-10-30 DIAGNOSIS — J45909 Unspecified asthma, uncomplicated: Secondary | ICD-10-CM | POA: Insufficient documentation

## 2021-10-30 DIAGNOSIS — N83201 Unspecified ovarian cyst, right side: Secondary | ICD-10-CM

## 2021-10-30 DIAGNOSIS — N80109 Endometriosis of ovary, unspecified side, unspecified depth: Secondary | ICD-10-CM | POA: Insufficient documentation

## 2021-10-30 DIAGNOSIS — N949 Unspecified condition associated with female genital organs and menstrual cycle: Secondary | ICD-10-CM

## 2021-10-30 DIAGNOSIS — F17200 Nicotine dependence, unspecified, uncomplicated: Secondary | ICD-10-CM | POA: Insufficient documentation

## 2021-10-30 DIAGNOSIS — N7011 Chronic salpingitis: Secondary | ICD-10-CM

## 2021-10-30 DIAGNOSIS — N83522 Torsion of left fallopian tube: Secondary | ICD-10-CM

## 2021-10-30 HISTORY — PX: LAPAROSCOPIC SALPINGO OOPHERECTOMY: SHX5927

## 2021-10-30 SURGERY — SALPINGO-OOPHORECTOMY, LAPAROSCOPIC
Anesthesia: General | Site: Uterus | Laterality: Bilateral

## 2021-10-30 MED ORDER — CEFOTETAN DISODIUM 2 G IJ SOLR
2.0000 g | INTRAMUSCULAR | Status: AC
Start: 2021-10-31 — End: 2021-10-30
  Administered 2021-10-30: 2 g via INTRAVENOUS

## 2021-10-30 MED ORDER — SUGAMMADEX SODIUM 200 MG/2ML IV SOLN
INTRAVENOUS | Status: DC | PRN
  Administered 2021-10-30: 200 mg via INTRAVENOUS

## 2021-10-30 MED ORDER — OXYCODONE HCL 5 MG PO TABS
ORAL_TABLET | ORAL | Status: AC
  Filled 2021-10-30: qty 1

## 2021-10-30 MED ORDER — ONDANSETRON HCL 4 MG/2ML IJ SOLN
INTRAMUSCULAR | Status: DC | PRN
  Administered 2021-10-30: 4 mg via INTRAVENOUS

## 2021-10-30 MED ORDER — MIDAZOLAM HCL 2 MG/2ML IJ SOLN
INTRAMUSCULAR | Status: AC
  Filled 2021-10-30: qty 2

## 2021-10-30 MED ORDER — LIDOCAINE HCL (CARDIAC) PF 100 MG/5ML IV SOSY
PREFILLED_SYRINGE | INTRAVENOUS | Status: DC | PRN
  Administered 2021-10-30: 60 mg via INTRAVENOUS

## 2021-10-30 MED ORDER — DEXAMETHASONE SODIUM PHOSPHATE 10 MG/ML IJ SOLN
INTRAMUSCULAR | Status: AC
  Filled 2021-10-30: qty 1

## 2021-10-30 MED ORDER — OXYCODONE HCL 5 MG PO TABS
5.0000 mg | ORAL_TABLET | ORAL | 0 refills | Status: AC | PRN
Start: 2021-10-30 — End: ?

## 2021-10-30 MED ORDER — SCOPOLAMINE 1 MG/3DAYS TD PT72: 1.0000 | MEDICATED_PATCH | TRANSDERMAL | Status: DC

## 2021-10-30 MED ORDER — FENTANYL CITRATE (PF) 100 MCG/2ML IJ SOLN
INTRAMUSCULAR | Status: DC | PRN
  Administered 2021-10-30 (×3): 50 ug via INTRAVENOUS

## 2021-10-30 MED ORDER — SCOPOLAMINE 1 MG/3DAYS TD PT72
MEDICATED_PATCH | TRANSDERMAL | Status: AC
Start: 2021-10-30 — End: ?
  Filled 2021-10-30: qty 1

## 2021-10-30 MED ORDER — PROPOFOL 10 MG/ML IV BOLUS
INTRAVENOUS | Status: DC | PRN
  Administered 2021-10-30: 150 mg via INTRAVENOUS

## 2021-10-30 MED ORDER — OXYCODONE HCL 5 MG/5ML PO SOLN: 5.0000 mg | Freq: Once | ORAL | Status: AC | PRN

## 2021-10-30 MED ORDER — ONDANSETRON HCL 4 MG/2ML IJ SOLN
INTRAMUSCULAR | Status: AC
  Filled 2021-10-30: qty 2

## 2021-10-30 MED ORDER — DEXAMETHASONE SODIUM PHOSPHATE 4 MG/ML IJ SOLN
INTRAMUSCULAR | Status: DC | PRN
  Administered 2021-10-30: 5 mg via INTRAVENOUS

## 2021-10-30 MED ORDER — CELECOXIB 200 MG PO CAPS
ORAL_CAPSULE | ORAL | Status: AC
  Filled 2021-10-30: qty 2

## 2021-10-30 MED ORDER — GABAPENTIN 300 MG PO CAPS
ORAL_CAPSULE | ORAL | Status: AC
  Filled 2021-10-30: qty 1

## 2021-10-30 MED ORDER — MIDAZOLAM HCL 5 MG/5ML IJ SOLN
INTRAMUSCULAR | Status: DC | PRN
Start: 2021-10-30 — End: 2021-10-30
  Administered 2021-10-30: 2 mg via INTRAVENOUS

## 2021-10-30 MED ORDER — OXYCODONE HCL 5 MG PO TABS
5.0000 mg | ORAL_TABLET | Freq: Once | ORAL | Status: AC | PRN
  Administered 2021-10-30: 5 mg via ORAL

## 2021-10-30 MED ORDER — PROPOFOL 10 MG/ML IV BOLUS
INTRAVENOUS | Status: AC
  Filled 2021-10-30: qty 20

## 2021-10-30 MED ORDER — FENTANYL CITRATE (PF) 100 MCG/2ML IJ SOLN
INTRAMUSCULAR | Status: AC
Start: 2021-10-30 — End: ?
  Filled 2021-10-30: qty 2

## 2021-10-30 MED ORDER — CELECOXIB 200 MG PO CAPS
400.0000 mg | ORAL_CAPSULE | ORAL | Status: AC
  Administered 2021-10-30: 400 mg via ORAL

## 2021-10-30 MED ORDER — FENTANYL CITRATE (PF) 100 MCG/2ML IJ SOLN
INTRAMUSCULAR | Status: AC
  Filled 2021-10-30: qty 2

## 2021-10-30 MED ORDER — SODIUM CHLORIDE 0.9 % IV SOLN
INTRAVENOUS | Status: AC
  Filled 2021-10-30: qty 2

## 2021-10-30 MED ORDER — ROCURONIUM BROMIDE 10 MG/ML (PF) SYRINGE
PREFILLED_SYRINGE | INTRAVENOUS | Status: AC
Start: 2021-10-30 — End: ?
  Filled 2021-10-30: qty 10

## 2021-10-30 MED ORDER — DOCUSATE SODIUM 100 MG PO CAPS: 100.0000 mg | ORAL_CAPSULE | Freq: Two times a day (BID) | ORAL | 2 refills | Status: AC | PRN

## 2021-10-30 MED ORDER — ROCURONIUM BROMIDE 100 MG/10ML IV SOLN
INTRAVENOUS | Status: DC | PRN
  Administered 2021-10-30: 40 mg via INTRAVENOUS
  Administered 2021-10-30: 10 mg via INTRAVENOUS

## 2021-10-30 MED ORDER — GABAPENTIN 300 MG PO CAPS
300.0000 mg | ORAL_CAPSULE | ORAL | Status: AC
  Administered 2021-10-30: 300 mg via ORAL

## 2021-10-30 MED ORDER — LACTATED RINGERS IV SOLN: INTRAVENOUS | Status: DC

## 2021-10-30 MED ORDER — BUPIVACAINE HCL (PF) 0.25 % IJ SOLN
INTRAMUSCULAR | Status: DC | PRN
  Administered 2021-10-30: 10 mL

## 2021-10-30 MED ORDER — IBUPROFEN 600 MG PO TABS: 600.0000 mg | ORAL_TABLET | Freq: Four times a day (QID) | ORAL | 2 refills | Status: AC | PRN

## 2021-10-30 MED ORDER — FENTANYL CITRATE (PF) 100 MCG/2ML IJ SOLN
25.0000 ug | INTRAMUSCULAR | Status: DC | PRN
  Administered 2021-10-30 (×2): 50 ug via INTRAVENOUS

## 2021-10-30 MED ORDER — LIDOCAINE 2% (20 MG/ML) 5 ML SYRINGE
INTRAMUSCULAR | Status: AC
  Filled 2021-10-30: qty 5

## 2021-10-30 MED ORDER — POVIDONE-IODINE 10 % EX SWAB: 2.0000 "application " | Freq: Once | CUTANEOUS | Status: DC

## 2021-10-30 MED ORDER — AMISULPRIDE (ANTIEMETIC) 5 MG/2ML IV SOLN: 10.0000 mg | Freq: Once | INTRAVENOUS | Status: DC | PRN

## 2021-10-30 SURGICAL SUPPLY — 32 items
ADH SKN CLS APL DERMABOND .7 (GAUZE/BANDAGES/DRESSINGS) ×1
BAG SPEC RTRVL LRG 6X4 10 (ENDOMECHANICALS) ×1
CABLE HIGH FREQUENCY MONO STRZ (ELECTRODE) IMPLANT
CATH ROBINSON RED A/P 16FR (CATHETERS) ×1 IMPLANT
DERMABOND ADVANCED (GAUZE/BANDAGES/DRESSINGS) ×1
DERMABOND ADVANCED .7 DNX12 (GAUZE/BANDAGES/DRESSINGS) ×1 IMPLANT
DRSG OPSITE POSTOP 3X4 (GAUZE/BANDAGES/DRESSINGS) IMPLANT
GLOVE SURG ENC MOIS LTX SZ6.5 (GLOVE) ×2 IMPLANT
GLOVE SURG LTX SZ7 (GLOVE) ×3 IMPLANT
GLOVE SURG UNDER POLY LF SZ7 (GLOVE) ×8 IMPLANT
GOWN STRL REUS W/ TWL LRG LVL3 (GOWN DISPOSABLE) ×2 IMPLANT
GOWN STRL REUS W/TWL LRG LVL3 (GOWN DISPOSABLE) ×6
GRASPER SUT TROCAR 14GX15 (MISCELLANEOUS) ×1 IMPLANT
HIBICLENS CHG 4% 4OZ BTL (MISCELLANEOUS) ×1 IMPLANT
IRRIG SUCT STRYKERFLOW 2 WTIP (MISCELLANEOUS) ×2
IRRIGATION SUCT STRKRFLW 2 WTP (MISCELLANEOUS) IMPLANT
NEEDLE INSUFFLATION 120MM (ENDOMECHANICALS) IMPLANT
NS IRRIG 1000ML POUR BTL (IV SOLUTION) ×2 IMPLANT
PACK LAPAROSCOPY BASIN (CUSTOM PROCEDURE TRAY) ×2 IMPLANT
PACK TRENDGUARD 450 HYBRID PRO (MISCELLANEOUS) IMPLANT
POUCH SPECIMEN RETRIEVAL 10MM (ENDOMECHANICALS) ×1 IMPLANT
SHEARS HARMONIC ACE PLUS 36CM (ENDOMECHANICALS) ×1 IMPLANT
SLEEVE SCD COMPRESS KNEE MED (STOCKING) ×2 IMPLANT
SLEEVE XCEL OPT CAN 5 100 (ENDOMECHANICALS) ×3 IMPLANT
SUT MNCRL AB 4-0 PS2 18 (SUTURE) ×3 IMPLANT
SUT VICRYL 0 UR6 27IN ABS (SUTURE) ×1 IMPLANT
SYR 50ML LL SCALE MARK (SYRINGE) ×1 IMPLANT
TOWEL GREEN STERILE FF (TOWEL DISPOSABLE) ×4 IMPLANT
TRAY FOLEY W/BAG SLVR 14FR LF (SET/KITS/TRAYS/PACK) ×1 IMPLANT
TRENDGUARD 450 HYBRID PRO PACK (MISCELLANEOUS) ×2
TROCAR XCEL NON-BLD 11X100MML (ENDOMECHANICALS) ×2 IMPLANT
TROCAR XCEL NON-BLD 5MMX100MML (ENDOMECHANICALS) ×2 IMPLANT

## 2021-10-30 NOTE — Anesthesia Procedure Notes (Signed)
Procedure Name: Intubation ?Date/Time: 10/30/2021 2:16 PM ?Performed by: Glory Buff, CRNA ?Pre-anesthesia Checklist: Patient identified, Emergency Drugs available, Suction available and Patient being monitored ?Patient Re-evaluated:Patient Re-evaluated prior to induction ?Oxygen Delivery Method: Circle system utilized ?Preoxygenation: Pre-oxygenation with 100% oxygen ?Induction Type: IV induction ?Ventilation: Mask ventilation without difficulty ?Laryngoscope Size: Sabra Heck and 3 ?Grade View: Grade I ?Tube type: Oral ?Tube size: 7.0 mm ?Number of attempts: 1 ?Airway Equipment and Method: Stylet and Oral airway ?Placement Confirmation: ETT inserted through vocal cords under direct vision, positive ETCO2 and breath sounds checked- equal and bilateral ?Secured at: 21 cm ?Tube secured with: Tape ?Dental Injury: Teeth and Oropharynx as per pre-operative assessment  ? ? ? ? ?

## 2021-10-30 NOTE — Interval H&P Note (Signed)
History and Physical Interval Note ?10/30/2021 ?12:27 PM ? ?Sandra Cordova  has presented today for surgery with the diagnosis of large complex ovarian cyst.  The various methods of treatment have been discussed with the patient and family. After consideration of risks, benefits and other options for treatment, the patient has consented to  Procedure(s): [place] as a surgical intervention.  The patient's history has been reviewed, patient examined, no change in status, stable for surgery.  I have reviewed the patient's chart and labs; benign pap smear on 10/09/20.  Questions were answered to the patient's satisfaction.  To OR when ready. ? ? ? ?Verita Schneiders, MD, FACOG ?Obstetrician Social research officer, government, Faculty Practice ?[facility] ?

## 2021-10-30 NOTE — Transfer of Care (Signed)
Immediate Anesthesia Transfer of Care Note ? ?Patient: Sandra Cordova ? ?Procedure(s) Performed: LAPAROSCOPIC SALPINGO OOPHORECTOMY (Bilateral) ? ?Patient Location: PACU ? ?Anesthesia Type:General ? ?Level of Consciousness: drowsy and patient cooperative ? ?Airway & Oxygen Therapy: Patient Spontanous Breathing and Patient connected to face mask oxygen ? ?Post-op Assessment: Report given to RN and Post -op Vital signs reviewed and stable ? ?Post vital signs: Reviewed and stable ? ?Last Vitals:  ?Vitals Value Taken Time  ?BP    ?Temp    ?Pulse    ?Resp    ?SpO2    ? ? ?Last Pain:  ?Vitals:  ? 10/30/21 1248  ?TempSrc: Oral  ?PainSc: 0-No pain  ?   ? ?  ? ?Complications: No notable events documented. ?

## 2021-10-30 NOTE — Op Note (Signed)
Sandra Cordova ?PROCEDURE DATE: 10/30/2021 ? ?PREOPERATIVE DIAGNOSES: Large complex right ovarian cyst ?POSTOPERATIVE DIAGNOSES: Large left hydrosalpinx and edematous left ovary, torsion of left adnexa, normal right adnexa ?PROCEDURE: Laparoscopic bilateral salpingoophorectomy ?SURGEON:  Dr. Verita Schneiders ?ASSISTANT:  Dr. Edwinna Areola. An experienced assistant was required given the standard of surgical care given the complexity of the case.  This assistant was needed for exposure, dissection, suctioning, retraction, instrument exchange, and for overall help during the procedure. ?ANESTHESIOLOGY TEAM: Anesthesiologist: Murvin Natal, MD and CRNA: Glory Buff, CRNA ? ?INDICATIONS: 56 y.o. [id number] with aforementioned preoperative diagnoses here today for definitive surgical management. She desired removal of the contralateral adnexa.   Risks of surgery were discussed with the patient including but not limited to: bleeding which may require transfusion or reoperation; infection which may require antibiotics; injury to bowel, bladder, ureters or other surrounding organs; need for additional procedures including laparotomy or subsequent procedures secondary to abnormal pathology; thromboembolic phenomenon, incisional problems and other postoperative/anesthesia complications. Written informed consent was obtained.   ? ?FINDINGS:  Small uterus, torsion of the left adnexa three times around the infundibulopelvic ligament with enlarged about ~10 cm left hydrosalpinx  and edematous left ovary.  Normal right adnexa.  No evidence of endometriosis, adhesions or any other abdominal/pelvic abnormality.  Normal upper abdomen. ? ?ANESTHESIA:    General ?ESTIMATED BLOOD LOSS: 15 ml ?SPECIMENS:  Bilateral ovaries and fallopian tubes sent to pathology ?COMPLICATIONS: None immediate ? ? ?PROCEDURE IN DETAIL:  The patient received intravenous antibiotics and had sequential compression devices applied to her lower  extremities while in the preoperative area.  She was then taken to the operating room where general anesthesia was administered and was found to be adequate.  She was placed in the dorsal lithotomy position, and was prepped and draped in a sterile manner.  A Foley catheter was inserted into her bladder and attached to constant drainage and a uterine manipulator was then advanced into the uterus . After an adequate timeout was performed, attention was then turned to the patient's abdomen where a 5-mm skin incision was made in the umbilical fold.  The Optiview 5-mm trocar and sleeve were then advanced without difficulty with the laparoscope under direct visualization into the abdomen.  The abdomen was then insufflated with carbon dioxide gas.  Adequate pneumoperitoneum was obtained.  A survey of the patient's pelvis and abdomen revealed the findings above. Bilateral lower quadrant ports were then placed under direct visualization, 10-mm on left and 5-mm on right. On the left side, the uteroovarian ligament was then clamped and transected with the Harmonic device.  The left infundibulopelvic ligament was also clamped and transected allowing for left salpingooophorectomy.  Care was given to avoid ureteral injury.  Excellent hemostasis was noted. A similar process was carried out on the right side allowing for right salpingooophorectomy.  The specimens were placed in an Endocatch bag and the hydrosalpinx was decompressed, then all specimens were removed from the abdomen in the Endocatch bag, through the 11-mm port using under direct visualization.  The operative site was surveyed, and it was found to be hemostatic.  No intraoperative injury to other surrounding organs was noted.  The abdomen was desufflated and all instruments were then removed from the patient's abdomen. The fascial incision of the 10-mm left lower quadrant was closed with a 0 Vicryl stitch with the help of a fascial closure device.  All skin incisions  were closed with 3-0 Monocryl subcuticular stitches and Dermabond.  The  patient tolerated the procedure well.  Sponge, lap, and needle counts were correct times three.  The patient was then taken to the recovery room awake, extubated and in stable condition. ? ?The patient will be discharged to home as per PACU criteria.  Routine postoperative instructions given.  She was prescribed Oxycodone, Ibuprofen and Colace.  She will follow up in the office in 2-3 weeks for postoperative evaluation. ? ? ?Verita Schneiders, MD, FACOG ?Obstetrician Social research officer, government, Faculty Practice ?[facility] ?

## 2021-10-30 NOTE — Anesthesia Preprocedure Evaluation (Addendum)
Anesthesia Evaluation  ?Patient identified by MRN, date of birth, ID band ?Patient awake ? ? ? ?Reviewed: ?Allergy & Precautions, NPO status , Patient's Chart, lab work & pertinent test results ? ?Airway ?Mallampati: III ? ?TM Distance: >3 FB ?Neck ROM: Full ? ? ? Dental ? ?(+) Missing, Loose,  ?  ?Pulmonary ?asthma , Current Smoker and Patient abstained from smoking.,  ?  ?Pulmonary exam normal ?breath sounds clear to auscultation ? ? ? ? ? ? Cardiovascular ?negative cardio ROS ?Normal cardiovascular exam ?Rhythm:Regular Rate:Normal ? ? ?  ?Neuro/Psych ?negative neurological ROS ? negative psych ROS  ? GI/Hepatic ?negative GI ROS, Neg liver ROS,   ?Endo/Other  ?negative endocrine ROS ? Renal/GU ?negative Renal ROS  ? ?  ?Musculoskeletal ?negative musculoskeletal ROS ?(+)  ? Abdominal ?(+) + obese,   ?Peds ? Hematology ?negative hematology ROS ?(+)   ?Anesthesia Other Findings ?Ovarian Cyst ? Reproductive/Obstetrics ? ?  ? ? ? ? ? ? ? ? ? ? ? ? ? ?  ?  ? ? ? ? ? ? ? ?Anesthesia Physical ?Anesthesia Plan ? ?ASA: 2 ? ?Anesthesia Plan: General  ? ?Post-op Pain Management:   ? ?Induction: Intravenous ? ?PONV Risk Score and Plan: 3 and Ondansetron, Dexamethasone, Midazolam, Scopolamine patch - Pre-op and Treatment may vary due to age or medical condition ? ?Airway Management Planned:  ? ?Additional Equipment:  ? ?Intra-op Plan:  ? ?Post-operative Plan: Extubation in OR ? ?Informed Consent: I have reviewed the patients History and Physical, chart, labs and discussed the procedure including the risks, benefits and alternatives for the proposed anesthesia with the patient or authorized representative who has indicated his/her understanding and acceptance.  ? ? ? ?Dental advisory given ? ?Plan Discussed with: CRNA ? ?Anesthesia Plan Comments:   ? ? ? ? ? ? ?Anesthesia Quick Evaluation ? ?

## 2021-10-30 NOTE — Discharge Instructions (Addendum)
Laparoscopic Surgery - Care After ?Laparoscopy is a surgical procedure. It is used to diagnose and treat diseases inside the belly(abdomen). It is usually a brief, common, and relatively simple procedure. The laparoscopeis a thin, lighted, pencil-sized instrument. It is like a telescope. It is inserted into your abdomen through a small cut (incision). Your caregiver can look at the organs inside your body through this instrument.  She can see if there is anything abnormal. ?Laparoscopy can be done either in a hospital or outpatient clinic. You may be given a mild sedative to help you relax before the procedure. Once in the operating room, you will be given a drug to make you sleep (general anesthesia). Laparoscopy usually lasts about 1 hour. After the procedure, you will be monitored in a recovery area until you are stable and doing well. Once you are home, it may take 3 to 7 days to fully recover. ? ?Laparoscopy has relatively few risks. Your caregiver will discuss the risks with you before the procedure. Some problems that can occur include: ?RISKS AND COMPLICATIONS  ?Allergies to medicines. ?Difficulty breathing. ?Bleeding. ?Infection. ?Damage to other surrounding structures ?HOME CARE INSTRUCTIONS  ?Infection. ?Bleeding. ?Damage to other organs. ?Anesthetic side effects.  ?Need for additional procedures such as open procedures/laparotomy ?PROCEDURE ?Once you receive anesthesia, your surgeon inflates the abdomen with a harmless gas (carbon dioxide). This makes the organs easier to see. The laparoscope is inserted into the abdomen through a small incision. This allows your surgeon to see into the abdomen. Other small instruments are also inserted into the abdomen through other small openings. Many surgeons attach a video camera to the laparoscope to enlarge the view. ?During a laparoscopy, the surgeon may be looking for inflammation, infection, or cancer.  The surgeon may also need to take out certain organs or  take tissue samples (biopsies). The specimens are sent to a specialist in looking at cells and tissue samples (pathologist). The pathologist examines them under a microscope to help to diagnose or confirm a disease. ?AFTER THE PROCEDURE  ?The incisions are closed with stitches (sutures) and Dermabond. Because these incisions are small (usually less than 1/2 inch), there is usually minimal discomfort after the procedure. There may also be discomfort from the instrument placement incisions in the abdomen. You will be given pain medicine to ease any discomfort. ?You will rest in a recovery room for 1-2 hours until you are stable and doing well. ?You may have some mild discomfort in the throat. This is from the tube placed in your throat while you were sleeping. ?You may experience discomfort in the shoulder area from some trapped air between the liver and diaphragm. This sensation is normal and will slowly go away on its own. ?The recovery time is shortened as long as there are no complications. ?You will rest in a recovery room until stable and doing well. As long as there are no complications, you may be allowed to go home. Someone will need to drive you home and be with you for at least 24 hours once home. ?FINDING OUT THE RESULTS ?You will be called with the results of the pathology and will discuss these results with  your caregiver during your postoperative appointment. Do not assume everything is normal if you have not heard from your caregiver or the medical facility. It is important for you to follow up on all of your results. ?HOME CARE INSTRUCTIONS  ?Take all medicines as directed. ?Only take over-the-counter or prescription medicines for pain, discomfort,  or fever as directed by your caregiver. ?Resume daily activities as directed. ?Showers are preferred over baths. ?You may resume sexual activities in 1 week or as directed. ?Do not drive while taking narcotics. ?SEEK MEDICAL CARE IF:  ?There is increasing  abdominal pain. ?You feel lightheaded or faint. ?You have the chills. ?You have an oral temperature above 102? F (38.9? C). ?There is pus-like (purulent) drainage from any of the wounds. ?You are unable to pass gas or have a bowel movement. ?You feel sick to your stomach (nauseous) or throw up (vomit). ?MAKE SURE YOU:  ?Understand these instructions. ?Will watch your condition. ?Will get help right away if you are not doing well or get worse. ? ?ExitCare? Patient Information ?2013 [employer]  ? ?*No Ibuprofen until after 7pm ? ?Post Anesthesia Home Care Instructions ? ?Activity: ?Get plenty of rest for the remainder of the day. A responsible individual must stay with you for 24 hours following the procedure.  ?For the next 24 hours, DO NOT: ?-Drive a car ?-Paediatric nurse ?-Drink alcoholic beverages ?-Take any medication unless instructed by your physician ?-Make any legal decisions or sign important papers. ? ?Meals: ?Start with liquid foods such as gelatin or soup. Progress to regular foods as tolerated. Avoid greasy, spicy, heavy foods. If nausea and/or vomiting occur, drink only clear liquids until the nausea and/or vomiting subsides. Call your physician if vomiting continues. ? ?Special Instructions/Symptoms: ?Your throat may feel dry or sore from the anesthesia or the breathing tube placed in your throat during surgery. If this causes discomfort, gargle with warm salt water. The discomfort should disappear within 24 hours. ? ?If you had a scopolamine patch placed behind your ear for the management of post- operative nausea and/or vomiting: ? ?1. The medication in the patch is effective for 72 hours, after which it should be removed.  Wrap patch in a tissue and discard in the trash. Wash hands thoroughly with soap and water. ?2. You may remove the patch earlier than 72 hours if you experience unpleasant side effects which may include dry mouth, dizziness or visual disturbances. ?3. Avoid touching the  patch. Wash your hands with soap and water after contact with the patch. ?    ?

## 2021-10-31 ENCOUNTER — Encounter (HOSPITAL_BASED_OUTPATIENT_CLINIC_OR_DEPARTMENT_OTHER): Payer: Self-pay | Admitting: Obstetrics & Gynecology

## 2021-10-31 NOTE — Anesthesia Postprocedure Evaluation (Signed)
Anesthesia Post Note ? ?Patient: MARIEN MANSHIP ? ?Procedure(s) Performed: LAPAROSCOPIC SALPINGO OOPHORECTOMY (Bilateral: Uterus) ? ?  ? ?Patient location during evaluation: PACU ?Anesthesia Type: General ?Level of consciousness: awake ?Pain management: pain level controlled ?Vital Signs Assessment: post-procedure vital signs reviewed and stable ?Respiratory status: spontaneous breathing, nonlabored ventilation, respiratory function stable and patient connected to nasal cannula oxygen ?Cardiovascular status: blood pressure returned to baseline and stable ?Postop Assessment: no apparent nausea or vomiting ?Anesthetic complications: no ? ? ?No notable events documented. ? ?Last Vitals:  ?Vitals:  ? 10/30/21 1630 10/30/21 1645  ?BP: 120/77 (!) 153/85  ?Pulse: 64 75  ?Resp: 11 18  ?Temp:  (!) 36.4 ?C  ?SpO2: 94% 93%  ?  ?Last Pain:  ?Vitals:  ? 10/30/21 1645  ?TempSrc:   ?PainSc: 5   ? ? ?  ?  ?  ?  ?  ?  ? ?Blair Mesina P Daniyah Fohl ? ? ? ? ?

## 2021-11-01 LAB — SURGICAL PATHOLOGY

## 2021-11-01 NOTE — Progress Notes (Signed)
Benign surgical pathology after surgery done 10/30/2021, showed endometriotic cyst.  Patient called at home over the phone, she was able to give two patient identifiers, and pathology results discussed with patient. No postoperative concerns reported.  Patient will follow up 11/11/2021 for postoperative appointment as scheduled. ? ? ?Verita Schneiders, MD

## 2021-11-04 ENCOUNTER — Encounter: Payer: Self-pay | Admitting: Obstetrics & Gynecology

## 2021-11-11 ENCOUNTER — Ambulatory Visit (INDEPENDENT_AMBULATORY_CARE_PROVIDER_SITE_OTHER): Payer: Self-pay | Admitting: Obstetrics & Gynecology

## 2021-11-11 ENCOUNTER — Other Ambulatory Visit: Payer: Self-pay

## 2021-11-11 ENCOUNTER — Encounter: Payer: Self-pay | Admitting: Obstetrics & Gynecology

## 2021-11-11 VITALS — BP 150/90 | HR 62 | Wt 186.0 lb

## 2021-11-11 DIAGNOSIS — Z09 Encounter for follow-up examination after completed treatment for conditions other than malignant neoplasm: Secondary | ICD-10-CM

## 2021-11-11 NOTE — Progress Notes (Signed)
? ? ?  GYNECOLOGY POSTOPERATIVE VISIT ? ?Subjective:  ?  ? Sandra Cordova is a 56 y.o. female who presents to the clinic 2 weeks status post laparoscopic bilateral salpingoophorectomy for  large left hydrosalpinx and edematous left ovary, torsion of left adnexa in a postmenopausal patient. The preoperative diagnosis was of a large complex right ovarian cyst.  She is accompanied by her husband. Eating a regular diet without difficulty. Bowel movements are normal. Pain is controlled with current analgesics. Medications being used: prescription NSAID's including Ibuprofen. ? ?The following portions of the patient's history were reviewed and updated as appropriate: allergies, current medications, past family history, past medical history, past social history, past surgical history, and problem list. ? ?Review of Systems ?Pertinent items noted in HPI and remainder of comprehensive ROS otherwise negative.  ?  ?Objective:  ? ? BP (!) 150/90   Pulse 62   Wt 186 lb (84.4 kg)   BMI 33.21 kg/m?  ?General:  alert and no distress  ?Abdomen: soft, bowel sounds active, non-tender  ?Incision:   healing well, no drainage, no erythema, no hernia, no seroma, no swelling, no dehiscence, incision well approximated  ?  ?10/30/2021 Surgical pathology ?FALLOPIAN TUBES, RIGHT AND LEFT, OVARY, OOPHORECTOMY:  ?- Larger ovary and fallopian tube  ?    Ovarian endometriotic cyst.  ?    Benign fallopian tube.  ?    No evidence of malignancy.  ?- Smaller ovary  ?    Unremarkable.  ?    Benign fallopian tube.  ?    No endometriosis or malignancy. ?  ?Assessment:  ? ? Doing well postoperatively. ?Operative findings again reviewed. Pathology report discussed.  ?  ?Plan:  ? ? 1. Continue any current medications. ?2. Wound care discussed. ?3. Activity restrictions: none ?4. Follow up as needed.  ? ?Of note, patient has been referred to [facility] for breast exam and mammogram. Had benign pap smear on 10/09/2021. ? ? ?Verita Schneiders, MD ? ?

## 2022-12-20 IMAGING — CT CT RENAL STONE PROTOCOL
2 of 4 series · 16 of 46 positions shown, 18 images · non-contrast
Comparison: None.

CLINICAL DATA: Left flank pain with kidney stone suspected.

EXAM:
CT ABDOMEN AND PELVIS WITHOUT CONTRAST
TECHNIQUE: Multidetector CT imaging of the abdomen and pelvis was performed
following the standard protocol without IV contrast.

[Series 3: stone study 5.0 i30f 2 · axial · 0.87mm/px · z∈[+654,+1074]mm · 13 of 94 slices shown, 15 images]
[im 5/94  soft-tissue]
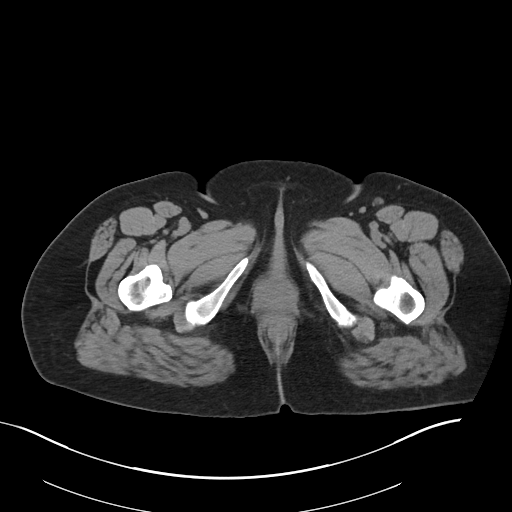
[im 5/94  bone]
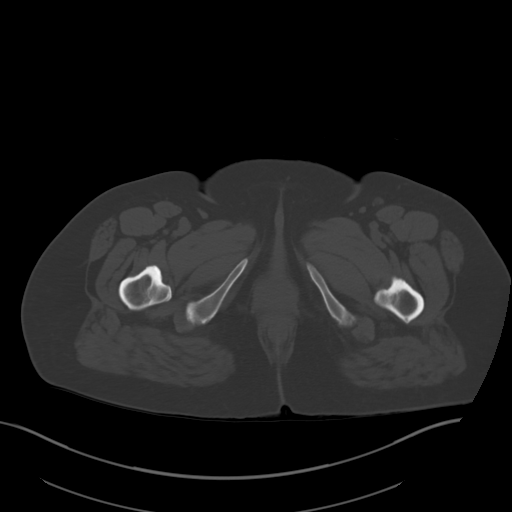
[im 13/94  soft-tissue]
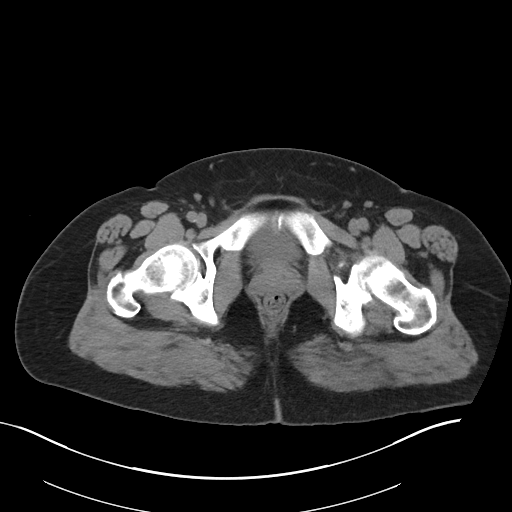
[im 21/94  soft-tissue]
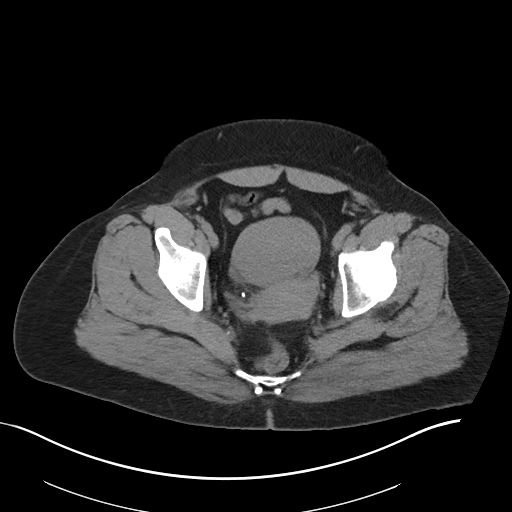
[im 25/94  soft-tissue]
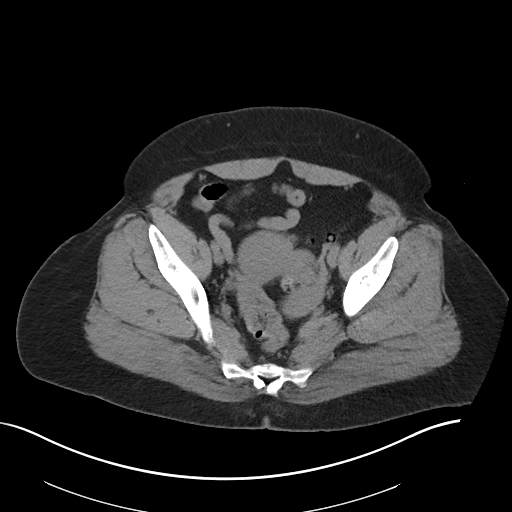
[im 33/94  soft-tissue]
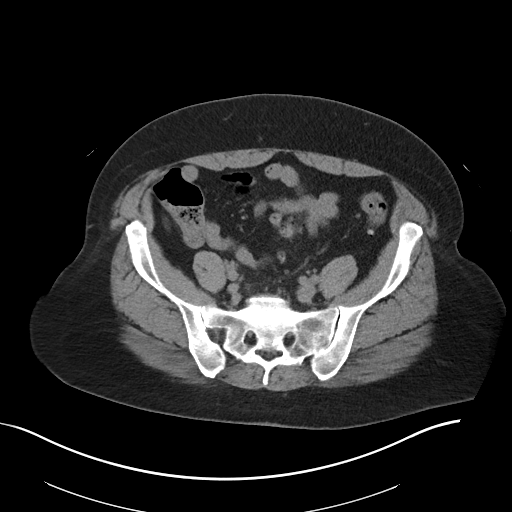
[im 41/94  soft-tissue]
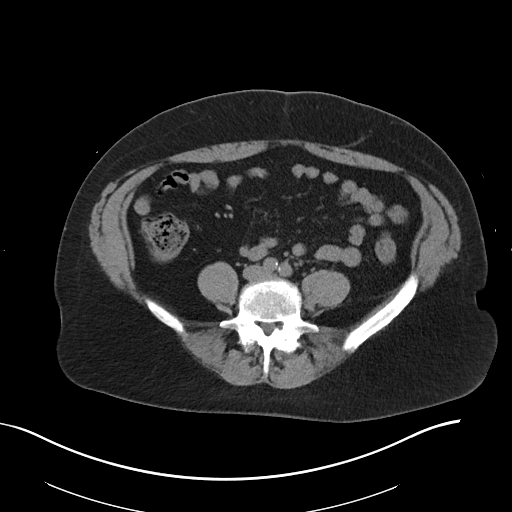
[im 49/94  soft-tissue]
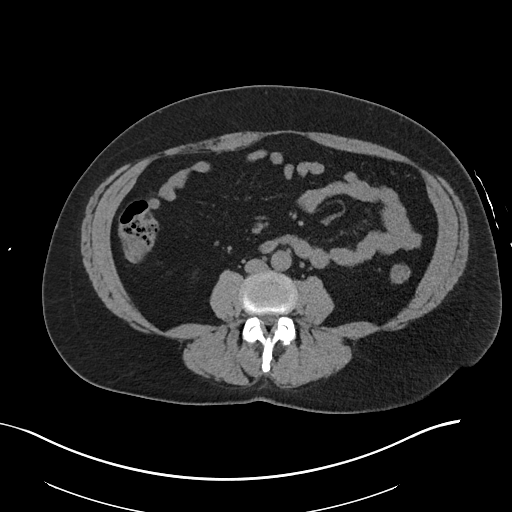
[im 53/94  soft-tissue]
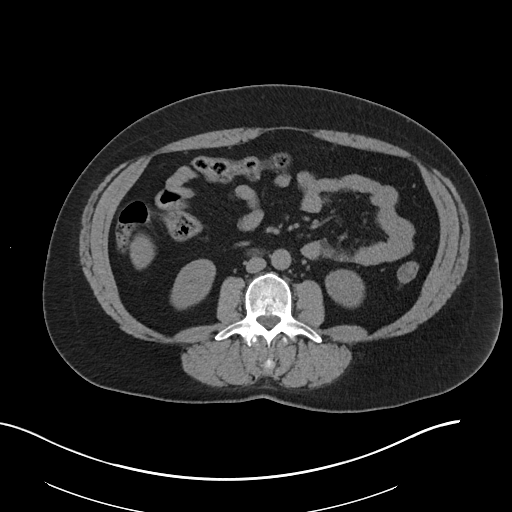
[im 61/94  soft-tissue]
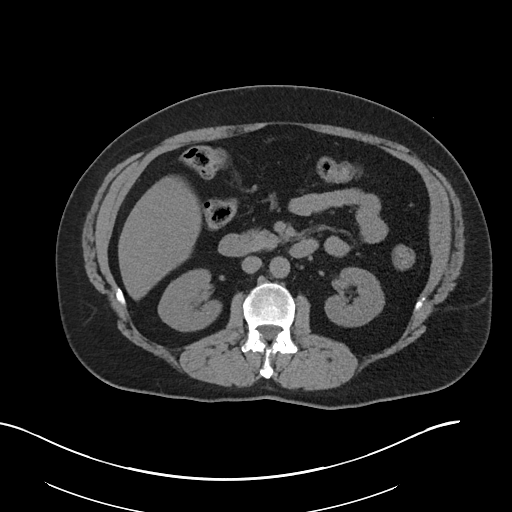
[im 61/94  bone]
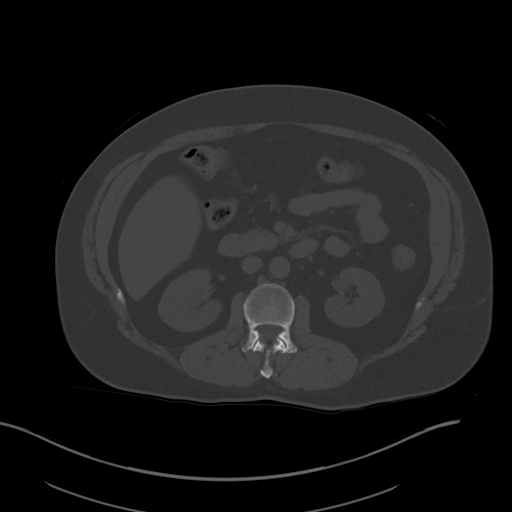
[im 69/94  soft-tissue]
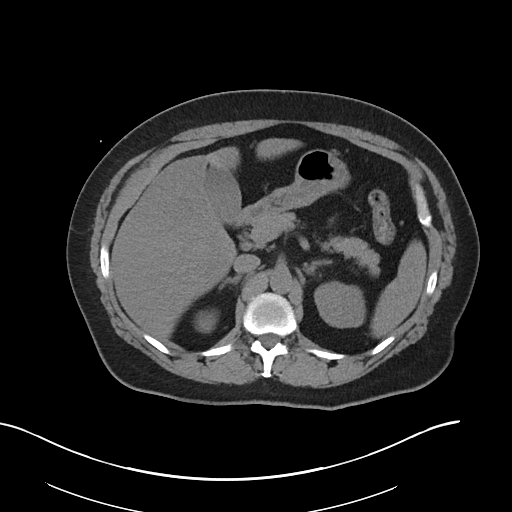
[im 73/94  soft-tissue]
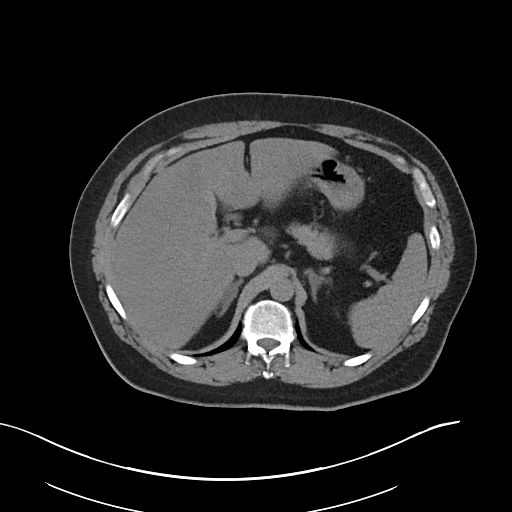
[im 81/94  soft-tissue]
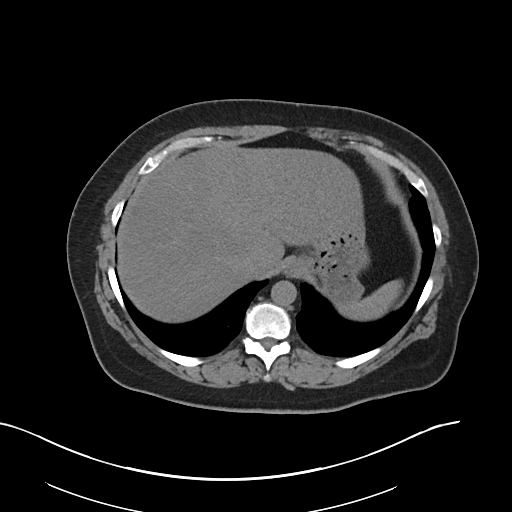
[im 89/94  soft-tissue]
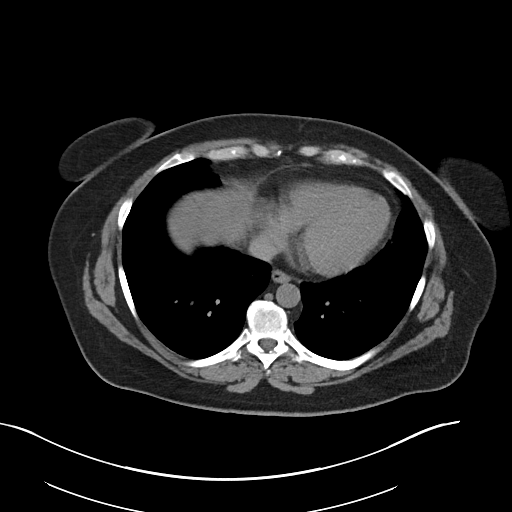

[Series 6: coronal soft tissue · coronal · 0.89mm/px · 3 of 107 slices shown]
[im 36/107  soft-tissue]
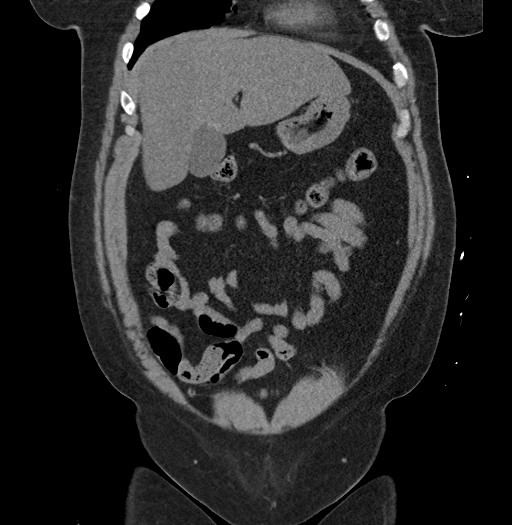
[im 48/107  soft-tissue]
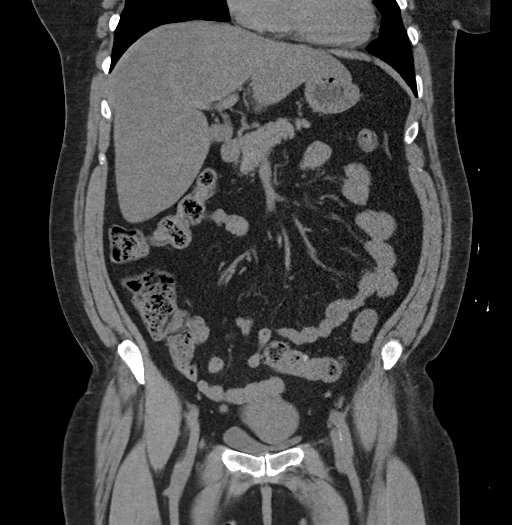
[im 59/107  soft-tissue]
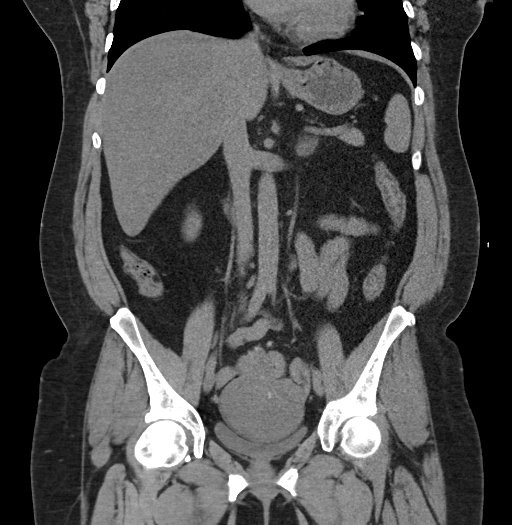

[16 of 46 positions shown; findings below may reference images not displayed]

FINDINGS: Lower chest: Lung bases are clear.

Hepatobiliary: Mild diffuse fatty infiltration of the liver. No
focal lesions identified. Gallbladder and bile ducts are
unremarkable.

Pancreas: Unremarkable. No pancreatic ductal dilatation or
surrounding inflammatory changes.

Spleen: Normal in size without focal abnormality.

Adrenals/Urinary Tract: 12 mm left adrenal gland nodule. Density
measurements are 1.4 Hounsfield units suggesting fat containing
adenoma. No follow-up indicated. Kidneys are symmetrical. No
hydronephrosis or hydroureter. No renal, ureteral, or bladder
stones.

Stomach/Bowel: Stomach, small bowel, and colon are not abnormally
distended. No wall thickening or inflammatory changes appreciated.
Sigmoid colonic diverticulosis without evidence of acute
diverticulitis. Appendix is not identified.

Vascular/Lymphatic: No significant vascular findings are present. No
enlarged abdominal or pelvic lymph nodes.

Reproductive: The uterus is displaced towards the left with an
exophytic calcified fibroid suggesting it. Soft tissue mass anterior
to the uterus, likely arising from the right ovary and measuring
x 7.8 cm. A pedunculated fibroid would be another possibility.
Ultrasound is suggested for further evaluation.

Other: No free air or free fluid in the abdomen. Abdominal wall
musculature appears intact.

Musculoskeletal: Mild degenerative changes in the spine. No
destructive bone lesions.
IMPRESSION: 1. No renal or ureteral stone or obstruction.
2. Diverticulosis of the sigmoid colon without evidence of acute
diverticulitis.
3. Central and right pelvic mass anterior to the uterus probably
represents an ovarian mass but could be a pedunculated fibroid.
Other fibroids are demonstrated. Ultrasound pelvis is recommended
for further evaluation.
4. Fatty infiltration of the liver.

## 2022-12-21 IMAGING — US US PELVIS COMPLETE TRANSABD/TRANSVAG W DUPLEX AND/OR DOPPLER
1 series · 13 of 25 positions shown · non-contrast
Comparison: Prior CT from 08/23/2021.

CLINICAL DATA: Initial evaluation for acute pelvic pain, abnormal
CT.

EXAM:
TRANSABDOMINAL AND TRANSVAGINAL ULTRASOUND OF PELVIS
DOPPLER ULTRASOUND OF OVARIES
TECHNIQUE: Both transabdominal and transvaginal ultrasound examinations of the
pelvis were performed. Transabdominal technique was performed for
global imaging of the pelvis including uterus, ovaries, adnexal
regions, and pelvic cul-de-sac.
It was necessary to proceed with endovaginal exam following the
transabdominal exam to visualize the endometrium and ovaries. Color
and duplex Doppler ultrasound was utilized to evaluate blood flow to
the ovaries.

[Series 1: us pelvic complete w transvaginal and torsion righ · 51 acquisitions, 13 frames shown]
[im 1/51]
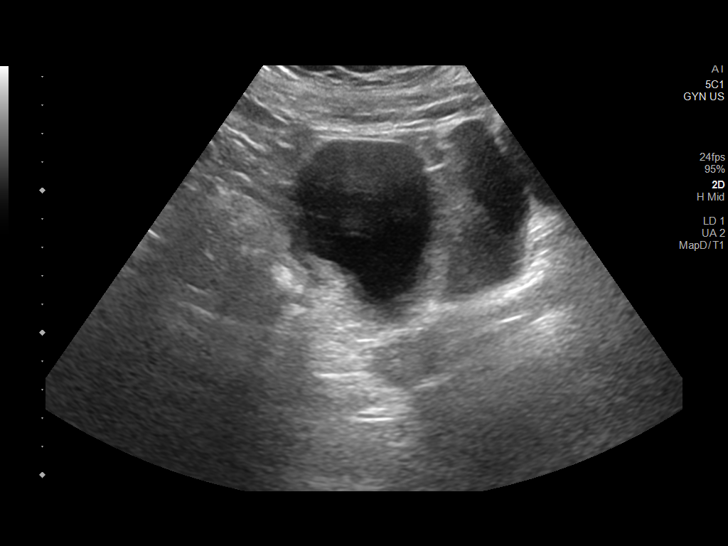
[im 5/51]
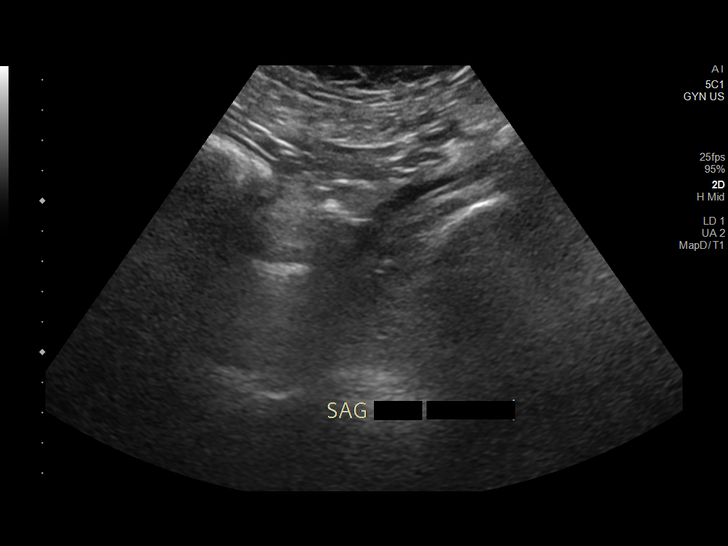
[im 9/51]
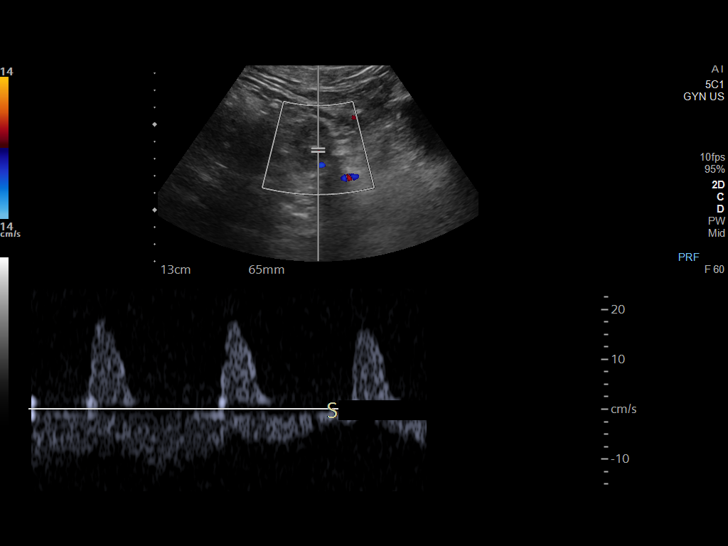
[im 13/51]
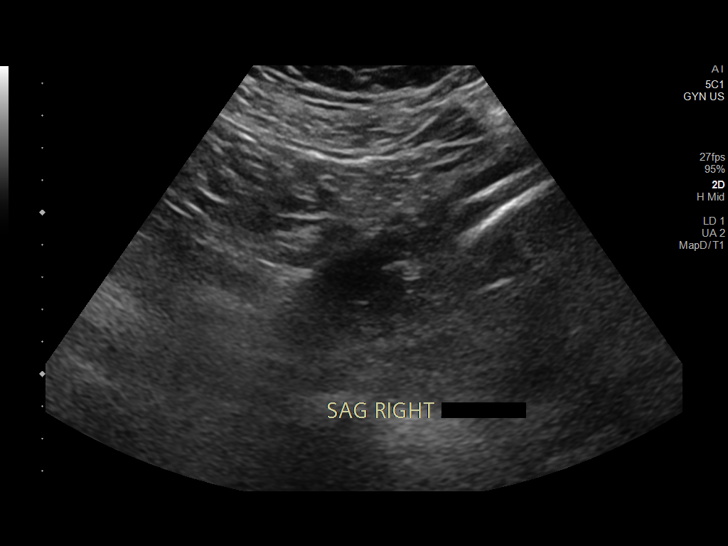
[im 17/51]
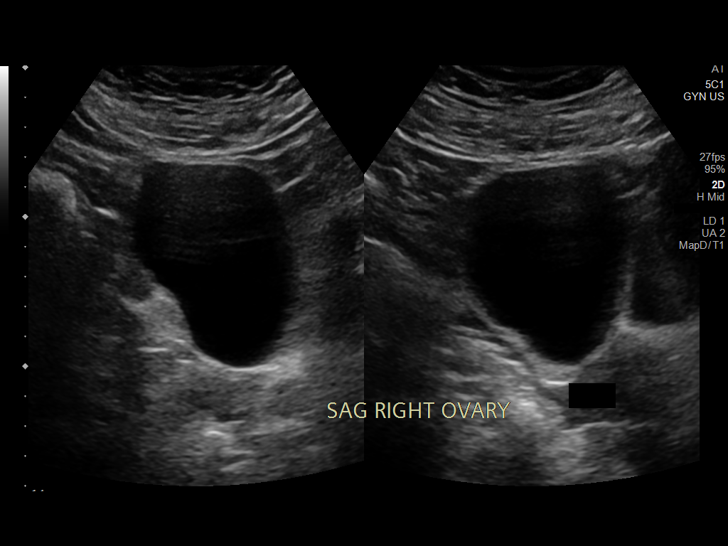
[im 21/51]
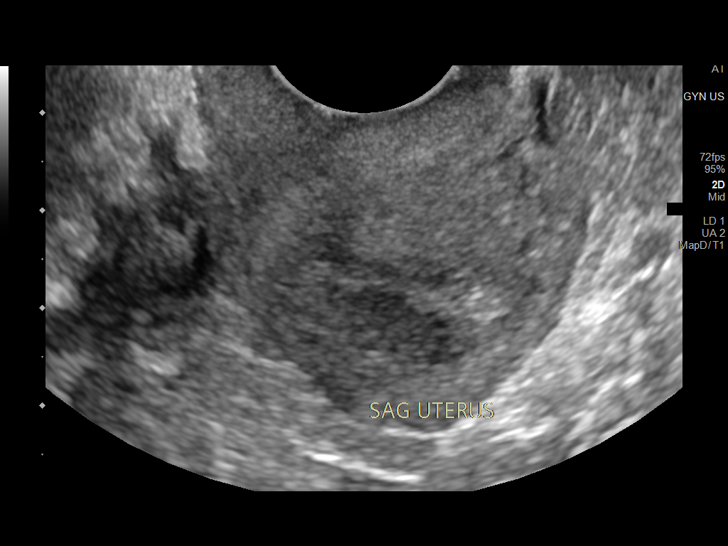
[im 26/51]
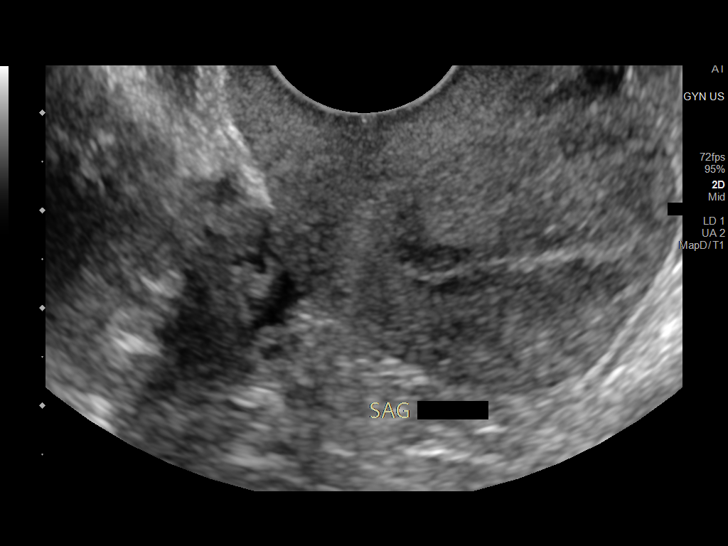
[im 30/51]
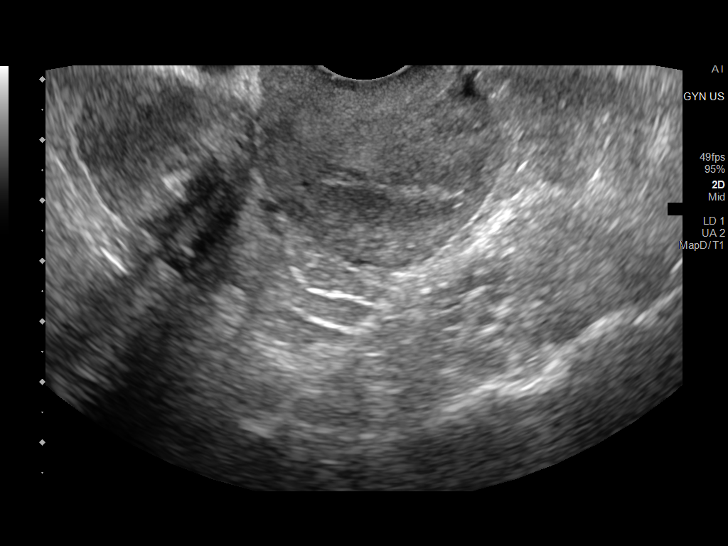
[im 34/51]
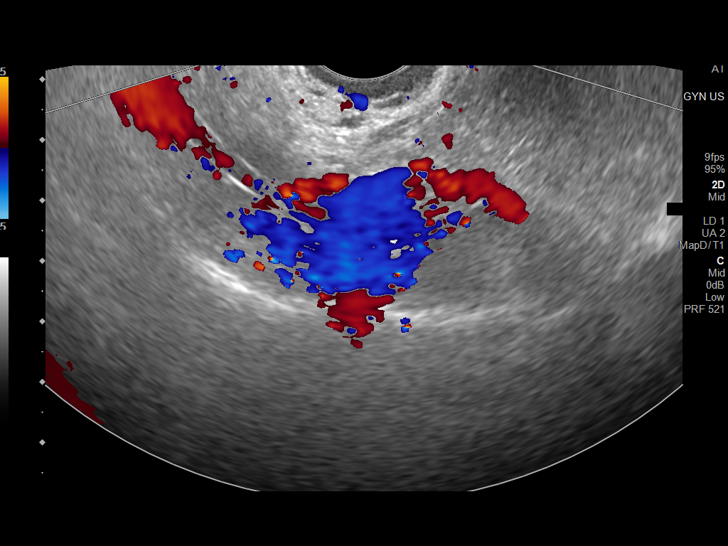
[im 38/51]
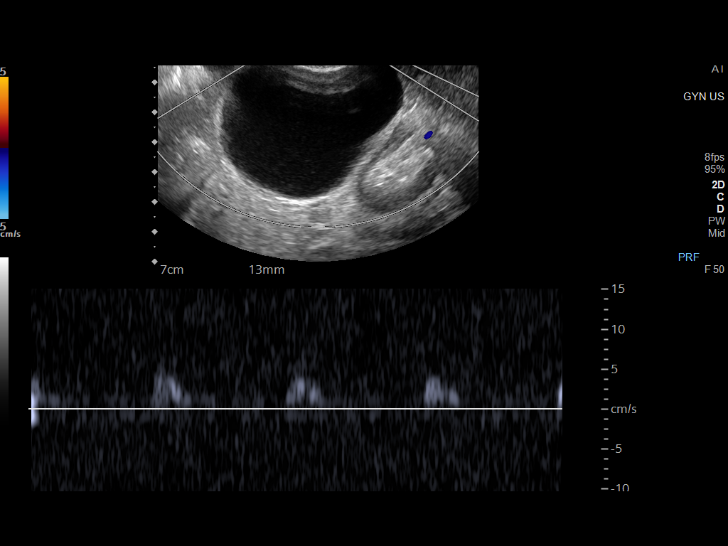
[im 42/51]
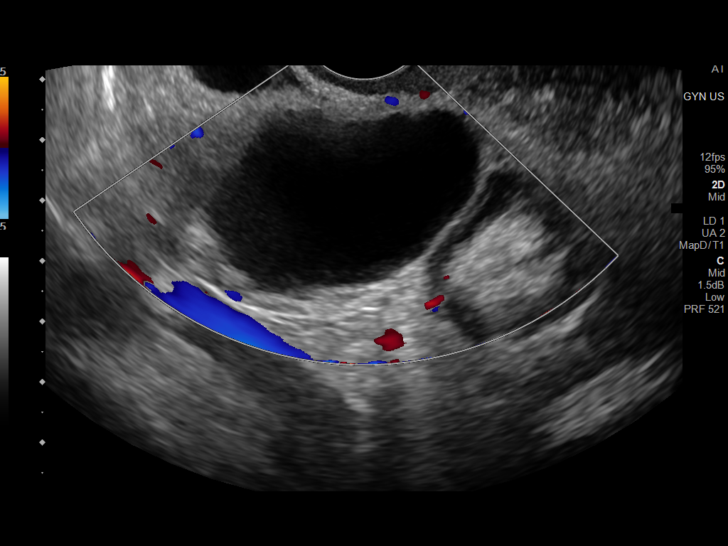
[im 46/51]
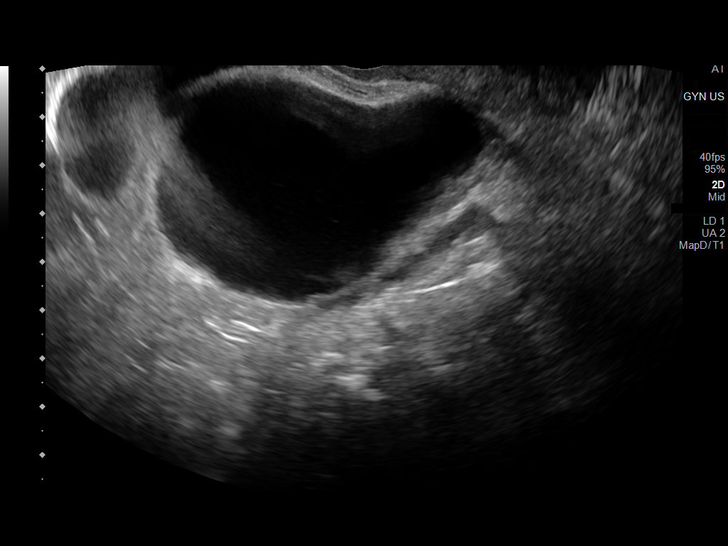
[im 51/51]
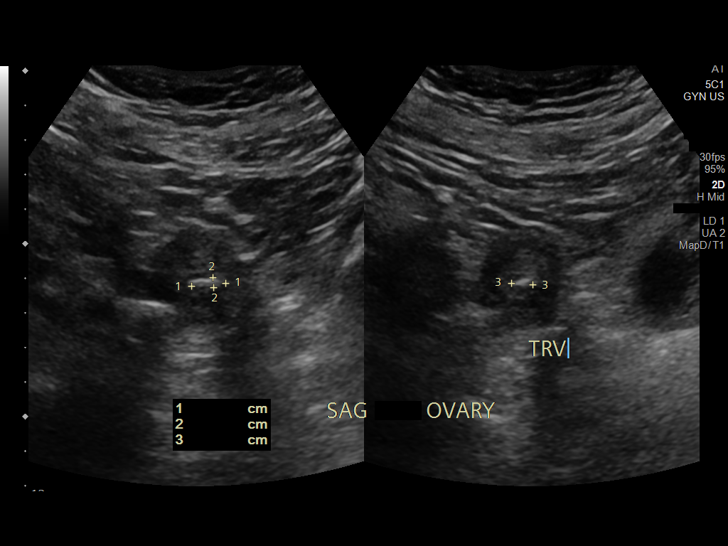

[13 of 25 positions shown; findings below may reference images not displayed]

FINDINGS: Uterus

Measurements: 5.5 x 3.2 x 4.2 cm = volume: 31.3 mL. Uterus is
retroverted. No discrete fibroid or other mass.

Endometrium

Thickness: 3.9 mm.  No focal abnormality visualized.

Right ovary

Measurements: 7.5 x 5.9 x 7.0 cm = volume: 161.6 mL. 6.9 x 5.0 x
cm cystic mass seen positioned within the central/right pelvis,
corresponding with abnormality on prior CT. This appears to arise
from the right ovary. Cyst is minimally complex in appearance with a
few scattered low-level internal echoes. 7 mm eccentric echogenic
focus suggestive of a small calcification, also seen on prior CT. No
internal vascularity or other solid component.

Left ovary

Measurements: 3.5 x 2.4 x 2.5 cm = volume: 10.7 mL. 1 cm echogenic
focus noted within the left ovary, likely a small calcification, of
doubtful significance. No other adnexal mass.

Pulsed Doppler evaluation of both ovaries demonstrates normal
low-resistance arterial and venous waveforms.

Other findings

No abnormal free fluid.
IMPRESSION: 1. 6.9 x 5.0 x 5.8 cm minimally complex cystic mass arising from the
right ovary, corresponding with abnormality on prior CT. Finding is
indeterminate, but could potentially reflect a cystic ovarian
neoplasm. Gynecologic consultation and referral for further workup
and evaluation recommended. Additionally, further assessment with
dedicated pelvic MRI, with and without contrast, may be helpful for
further evaluation as warranted.
2. No evidence for ovarian torsion.
3. Otherwise normal sonographic appearance uterus and endometrium. 1
cm echogenic focus within the left ovary likely reflects a small
calcification, of doubtful significance. No other adnexal mass or
free fluid.
